# Patient Record
Sex: Male | Born: 1955
Health system: Southern US, Community
[De-identification: ages and names within clinical notes are randomized; demographics above are authoritative.]

## PROBLEM LIST (undated history)

## (undated) DIAGNOSIS — I700 Atherosclerosis of aorta: Secondary | ICD-10-CM

## (undated) DIAGNOSIS — E119 Type 2 diabetes mellitus without complications: Secondary | ICD-10-CM

## (undated) DIAGNOSIS — G8929 Other chronic pain: Secondary | ICD-10-CM

## (undated) DIAGNOSIS — I10 Essential (primary) hypertension: Secondary | ICD-10-CM

## (undated) DIAGNOSIS — E785 Hyperlipidemia, unspecified: Secondary | ICD-10-CM

## (undated) HISTORY — DX: Atherosclerosis of aorta: I70.0

## (undated) HISTORY — DX: Other chronic pain: G89.29

## (undated) HISTORY — DX: Type 2 diabetes mellitus without complications: E11.9

## (undated) HISTORY — DX: Hyperlipidemia, unspecified: E78.5

## (undated) HISTORY — DX: Essential (primary) hypertension: I10

---

## 1998-11-08 ENCOUNTER — Encounter: Payer: Self-pay | Admitting: Emergency Medicine

## 1998-11-08 ENCOUNTER — Emergency Department (HOSPITAL_COMMUNITY): Admission: EM | Admit: 1998-11-08 | Discharge: 1998-11-08 | Payer: Self-pay | Admitting: Emergency Medicine

## 1999-12-22 ENCOUNTER — Emergency Department (HOSPITAL_COMMUNITY): Admission: EM | Admit: 1999-12-22 | Discharge: 1999-12-22 | Payer: Self-pay | Admitting: Emergency Medicine

## 2001-08-25 ENCOUNTER — Encounter: Payer: Self-pay | Admitting: Infectious Diseases

## 2001-08-25 ENCOUNTER — Ambulatory Visit (HOSPITAL_COMMUNITY): Admission: RE | Admit: 2001-08-25 | Discharge: 2001-08-25 | Payer: Self-pay | Admitting: Infectious Diseases

## 2002-02-21 ENCOUNTER — Emergency Department (HOSPITAL_COMMUNITY): Admission: EM | Admit: 2002-02-21 | Discharge: 2002-02-21 | Payer: Self-pay | Admitting: Emergency Medicine

## 2002-02-24 ENCOUNTER — Encounter: Admission: RE | Admit: 2002-02-24 | Discharge: 2002-05-25 | Payer: Self-pay | Admitting: Emergency Medicine

## 2009-09-20 ENCOUNTER — Ambulatory Visit (HOSPITAL_COMMUNITY): Admission: RE | Admit: 2009-09-20 | Discharge: 2009-09-20 | Payer: Self-pay | Admitting: Gastroenterology

## 2011-01-15 LAB — GLUCOSE, CAPILLARY
Glucose-Capillary: 124 mg/dL — ABNORMAL HIGH (ref 70–99)
Glucose-Capillary: 149 mg/dL — ABNORMAL HIGH (ref 70–99)

## 2012-12-31 ENCOUNTER — Encounter: Payer: 59 | Attending: Family Medicine | Admitting: *Deleted

## 2012-12-31 ENCOUNTER — Encounter: Payer: Self-pay | Admitting: *Deleted

## 2012-12-31 DIAGNOSIS — Z713 Dietary counseling and surveillance: Secondary | ICD-10-CM | POA: Insufficient documentation

## 2012-12-31 DIAGNOSIS — E119 Type 2 diabetes mellitus without complications: Secondary | ICD-10-CM | POA: Insufficient documentation

## 2012-12-31 NOTE — Patient Instructions (Signed)
Plan:  Aim for 3-4 Carb Choices per meal (45-60 grams) +/- 1 either way  Aim for 0-2 Carbs per snack if hungry  Consider reading food labels for Total Carbohydrate of foods Continue with your activity level by jogging for 30-60 minutes daily as tolerated Consider checking BG at alternate times during the day to pick up any patterns

## 2012-12-31 NOTE — Progress Notes (Signed)
  Medical Nutrition Therapy:  Appt start time: 1500 end time:  1600.  Assessment:  Primary concerns today: diabetes refresher. He attended class about 4 years ago. Lives alone, does own shopping and meal preparation. Works for [employer] as a Engineer, civil (consulting) on various units. He is full time 7 AM to 7 PM 3 days a week. Enjoys reading , listening to music, and jogging outside.   MEDICATIONS: see list   DIETARY INTAKE:  Usual eating pattern includes 3 meals and 2-3 snacks per day.  Everyday foods include good variety of all food groups.  Avoided foods include chips, pork, .    24-hr recall:  B ( AM): used to skip, not hungry. Has started eating grits x 1 cup with water and OJ x 6 oz  Snk ( AM): usually fresh fruit  L ( PM): brings from home: salad OR cooked vegetables, less bread lately, water Snk ( PM): same as AM D (8 PM): prepares himself: lean meat, starch, and salad with Cesar or Olive oil dressing. Mix mango or quava juice with water Snk ( PM): banana or other fresh fruit Beverages: water, fruit juices  Usual physical activity: enjoy jogging twice a week in the park. Active praying 5 times a day  Estimated energy needs: 1600 calories 180 g carbohydrates 120 g protein 44 g fat  Progress Towards Goal(s):  In progress.   Nutritional Diagnosis:  NB-1.1 Food and nutrition-related knowledge deficit As related to diabetes management.  As evidenced by increase in A1c to 7.2%.    Intervention:  Nutrition counseling and diabetes educationreviewed. Discussed basic physiology of diabetes, SMBG and rationale of checking BG at alternate times of day, A1c, Carb Counting and benefits of continued activity. He shared that with his Muslim religion he prays about 5 times a day moving from a standing to kneeling to prone position which is a form of activity too. . Plan:  Aim for 3-4 Carb Choices per meal (45-60 grams) +/- 1 either way  Aim for 0-2 Carbs per snack if hungry  Consider reading food labels for  Total Carbohydrate of foods Continue with your activity level by jogging for 30-60 minutes daily as tolerated Consider checking BG at alternate times during the day to pick up any patterns  Handouts given during visit include: Living Well with Diabetes Carb Counting and Food Label handouts Meal Plan Card  Diabetes Medication Handout  Monitoring/Evaluation:  Dietary intake, exercise, SMBG at alternate times of day, and body weight prn. To continue with follow up with Gilman Buttner, RN at [facility] in September

## 2013-05-07 ENCOUNTER — Ambulatory Visit (INDEPENDENT_AMBULATORY_CARE_PROVIDER_SITE_OTHER): Payer: Self-pay | Admitting: Family Medicine

## 2013-05-07 VITALS — BP 134/80 | HR 73 | Ht 71.0 in | Wt 166.0 lb

## 2013-05-07 DIAGNOSIS — E119 Type 2 diabetes mellitus without complications: Secondary | ICD-10-CM | POA: Insufficient documentation

## 2013-05-07 NOTE — Progress Notes (Signed)
Subjective:  Patient presents for his first pharmacy appointment with the Link to [facility].   Patient presents today for 3 month diabetes follow-up as part of the employer-sponsored Link to Wellness program. Current diabetes regimen includes metformin & glipizide. Patient also continues on daily ACEi, and statin. Most recent MD follow-up was in April with Dr. Docia Chuck. Patient has a pending appt next month to see his PCP. At his last appointment glipizide was decreased to 10 mg in the morning and 5 mg in the evening because he was having hypoglycemia in the evening and bedtime.     Disease Assessments:  Diabetes:  Type of Diabetes: Type 2; Year of diagnosis 2005; Sees Diabetes provider 2 times per year; Diabetes Education [facility] 7 yrs ago. Will schedule for 1:1 refresher with dietitian @ [facility]; checks feet daily; checks blood glucose 2 times a day; uses glucometer; Lowest CBG 62; takes medications as prescribed; MD managing Diabetes Dibas Koirala; Current Diabetes related medical conditions are High blood pressure, High cholesterol; hypoglycemia frequency some;   7 day CBG average 137; 14 day CBG average 145; 30 day CBG average 132; Highest CBG 199;   Other Diabetes History: He has been checking his blood sugar twice daily- usually in the morning fasting, and then again in the evening post prandial, usually 1-2 hours after dinner.   He esimates he is having low blood sugars 2-3% of the time. Since cutting back on the glipizide dose he states that these have gotten better.      Nutrition- He is observing Ramadan now so he is only eating two meals a day, before sunrise and after sunset. He has recently lost weight.   Typical Day (non ramadan) he states that he pays close attention to what he eats and controls portion sizes.   B- cereal or egg sandwich. drinks coffee. sometimes orange juice, but this is not only day.   L- sometimes he doesn't eat; chicken sandwich. He admits to usually eating  a small piece of meat for lunch.   D- soup, fruits, bread.   He is lactose intolerant. He makes his own bread so as to avoid milk.   Physical Activity-  Stretching at home. Jogging at the park 3 days a week for about an hour.    Vital Signs:  05/07/2013 2:43 PM (EST) BMI 23.1; Height 5 ft 11 in; Weight 166 lbs    Testing:  Blood Sugar Tests:  Hemoglobin A1c: 7.0     Assessment/Plan: Patient is a 57 year old male with DM2. Most recent A1C was 7.0% which is close to meeting glycemic goal of less than 7%. Most recent CBG readings from his meter are somewhat erratic. He has been observing Ramadan for the past month and his eating habits are off his normal routine right now, so this may explain the fluctation in CBGs. He states he has lost weight and is down approximately 10 pounds since his last appointment with Harriett Sine 3 months ago.   Patient is engaging in physical activity three times a week and is getting 150 minutes of activity each week. Besides skipping lunch, nothing stood out on a 24 hour recall. I encouraged him to get something to eat during lunch daily, even if it was something small.   Follow up with patient in 3 months..    Goals for next visit-  1. Be mindful of meals- watch portion control.  2. Make sure that you eat something at lunch every day, even if it  is a small amount of food.  3. Increase how much water you are drinking.  4. Continue physical activity and jogging.   Next appointment is Monday October 27th at 11 AM.

## 2013-05-18 NOTE — Progress Notes (Signed)
Patient ID: Mike Hart, male   DOB: Jan 23, 1956, 57 y.o.   MRN: [id number] ATTENDING PHYSICIAN NOTE: I have reviewed the chart and agree with the plan as detailed above. Denny Levy MD Pager [phone]

## 2013-09-15 ENCOUNTER — Ambulatory Visit (INDEPENDENT_AMBULATORY_CARE_PROVIDER_SITE_OTHER): Payer: Self-pay | Admitting: Family Medicine

## 2013-09-15 VITALS — BP 118/70 | Wt 163.0 lb

## 2013-09-15 DIAGNOSIS — E119 Type 2 diabetes mellitus without complications: Secondary | ICD-10-CM

## 2013-09-15 NOTE — Progress Notes (Signed)
Patient presents today for 3 month diabetes follow-up as part of the employer-sponsored Link to Wellness program. Current diabetes regimen includes glipizide and metformin. Patient also continues on daily ACEi and statin. Most recent MD follow-up was this past October with Dr. Docia Chuck. At this time glipizide dose was decreased. Patient has a pending appt for 6 month follow-up. No other med changes or major health changes at this time.   Diabetes Assessment: Type of Diabetes: Type 2; Year of diagnosis 2005; Sees Diabetes provider 2 times per year; checks feet daily; checks blood glucose 2 times a day; uses glucometer; takes medications as prescribed; MD managing Diabetes Dibas Koirala; hypoglycemia frequency some; 30 day CBG average 124; 7 day CBG average 118; 14 day CBG average 122; Lowest CBG 71; Highest CBG 171; A1c 7.2 (prev 7.2) Other Diabetes History: Current med regimen includes Metformin 1000 mg twice daily and glipizide 10 mg AM and 5 mg PM. This is a dose decrease of glipizide due to hypoglycemia. Previously glipizide was 10 mg BID. Hypoglycemia has become less frequent since dose adjustment. Patient does maintain good medication compliance. Patient did bring meter today and is currently testing 2-3 times per day. Glucose monitoring occurs fasting/pre-prandial (supper)/at bedtime/when symptomatic. Glucose averages have improved and are ~20 mg/dl lower than those 3 months ago. Hypoglycemia frequency is only on occassional, and has dropped to 70s a couple times over previous month. Patient does not demonstrate appropriate correction of hypoglycemia, but we did review this today. I have provided a handout for patient and he has requested to purchase glucose tablets. Patient reports signs and symptoms of neuropathy including numbness/tingling/burning and symptoms of foot infection. Patient is due for yearly eye exam and will schedule an appt soon.  Lifestyle Factors: Nutrition- Patient feels his diet is  under good control. He reports eating 3-4 servings of fruit/vegetables per day and tries to eat a balanced diet of carbohydrate and protein. He does report lack of appetite at times, but attributes this to his body telling him he does not need anything at that time. I have recommneded he eat a little something for each main meal of the day to avoid hypoglycemia given he is on glipizide. Patient understands this.  B- cereal or egg sandwich. drinks coffee. sometimes orange juice, but this is not only day.  L- sometimes he doesn't eat; chicken sandwich. He admits to usually eating a small piece of meat for lunch.  D- soup, fruits, bread.  Physical Activity- Exercises at home daily including stretching. When weather is nice, pt jogs at a local park, usually a couple miles each time. He admits that he has not been compliant with jogging lately due to colder temperatures, but will try to get back on track as he finds this to be a great method of stress relief.   Assessment: Patient is a 57 year old male with DM2. Most recent A1C was 7.2% which is close to meeting glycemic goal of less than 7%. He maintains a healthy diet and attempts to exercise, but could use improvement in this area due to cooler temperatures. Patient will resume follow-up with Megan in 3 months.   Plan: 1) Continue making healthy dietary choices 2) Continue exercising daily, and resume jogging at least twice per week 3) Continue testing glucose regularly 4) Follow-up with Megan in 3 months on Monday March 9th @ 10:00 am

## 2014-01-05 ENCOUNTER — Ambulatory Visit (INDEPENDENT_AMBULATORY_CARE_PROVIDER_SITE_OTHER): Payer: Self-pay | Admitting: Family Medicine

## 2014-01-05 VITALS — BP 118/74 | Wt 167.0 lb

## 2014-01-05 DIAGNOSIS — E119 Type 2 diabetes mellitus without complications: Secondary | ICD-10-CM

## 2014-01-05 NOTE — Progress Notes (Signed)
Patient presents today for 3 month diabetes follow-up as part of the employer-sponsored Link to Wellness program. Current diabetes regimen includes Metformin and glipizide. Patient also continues on daily ACEi and statin. Most recent MD follow-up was Nov 2014. Patient has a pending appt for May 2015. He is seen every 6 mo for follow-up. No med changes or major health changes at this time.   Diabetes Assessment: Type of Diabetes: Type 2; Year of diagnosis 2005; Sees Diabetes provider 2 times per year; [facility]. [facility]; checks feet daily; checks blood glucose 2 times a day; uses glucometer; takes medications as prescribed; MD managing Diabetes Dibas Koirala; Current Diabetes related medical conditions are High blood pressure, High cholesterol; hypoglycemia frequency some; 30 day CBG average 129; 7 day CBG average 134; 14 day CBG average 134; Lowest CBG 65; Highest CBG 180; does not take an aspirin a day; Other Diabetes History: Current med regimen includes Metformin 1000 mg twice daily and glipizide 10 mg AM and 5 mg PM. Patient continues doing well on these medications. Patient reports good medication compliance, but patient is well overdue for prescription refill, so I am unsure of his true compliance. Patient did bring meter today and is currently testing twice per day. Glucose monitoring occurs fasting/pre-prandial (supper)/at bedtime/when symptomatic. Hypoglycemia frequency is only on occassional, and has dropped to 60s a couple times over previous month. Patient does demonstrate appropriate correction of hypoglycemia. Patient denies signs and symptoms of neuropathy including numbness/tingling/burning and symptoms of foot infection. Patient is not due for yearly eye exam and was recently evaluated in Feb 2015.   Lifestyle Factors: Nutrition- No changes to diet. Patient feels his diet is under good control. He reports eating 3-4 servings  of fruit/vegetables per day and tries to eat a balanced diet of carbohydrate and protein. He does report lack of appetite at times, but attributes this to his body telling him he does not need anything at that time. I have recommneded he eat a little something for each main meal of the day to avoid hypoglycemia given he is on glipizide. Patient understands this.  B- cereal or egg sandwich. drinks coffee and tea. sometimes orange juice, but this is not only day.  L- sometimes he doesn't eat; chicken sandwich. He admits to usually eating a small piece of meat for lunch.  D- soup, fruits, bread, limiting starches and sweets at night to reduce AM bloating Physical Activity-  Exercises at home daily including stretching. Pt walks or jogs at a local park, 2-3 times per week, usually a couple miles each time. He has recently been more compliant with exercise given the nicer weather.   Assessment: Patient is a 58 year old male with DM2. Most recent A1C was 7.2% which is close to meeting glycemic goal of less than 7%. He maintains a healthy diet and attempts to exercise, but could use improvement in this area due to cooler temperatures. Patient also appears to struggle with medication compliance given refill history. We will revisit this at next appt. Patient will follow-up in 3 months.   Plan: 1) Continue to maintain a healthy diet 2) Continue to exercise regularly, goal of 150 minutes per week 3) Continue testing regularly 4) Follow-up appt with MD in May 5) Follow-up in 3 months on Wednesday June 24th @ 1:30

## 2014-04-06 ENCOUNTER — Ambulatory Visit (INDEPENDENT_AMBULATORY_CARE_PROVIDER_SITE_OTHER): Payer: Self-pay | Admitting: Family Medicine

## 2014-04-06 DIAGNOSIS — E119 Type 2 diabetes mellitus without complications: Secondary | ICD-10-CM

## 2014-04-08 NOTE — Progress Notes (Signed)
Patient presents today for 3 month diabetes follow-up as part of the employer-sponsored Link to Wellness program. Current diabetes regimen includes Metformin and glipizide. Patient also continues on daily ACEi and statin. Most recent MD follow-up was May 2015. Patient has a pending appt for Nov 2015. He is seen every 6 mo for follow-up. No med changes or major health changes at this time.  Diabetes Assessment: Type of Diabetes: Type 2; Year of diagnosis 2005; Sees Diabetes [place] [facility] 2 times per year; [facility]. Will [facility]; checks feet daily; checks blood glucose 2 times a day; uses glucometer; takes medications as prescribed; MD managing Diabetes Dibas Koirala; Current Diabetes related medical conditions are High blood pressure, High cholesterol; hypoglycemia frequency some; does not take an aspirin a day; 30 day CBG average 136; 7 day CBG average 164; 14 day CBG average 140; Highest CBG 213; Lowest CBG 83 Other Diabetes History: Current med regimen includes Metformin 1000 mg twice daily and glipizide 5 mg twice daily. In the past, pt was taking glipizide 10 mg AM and 5 mg PM, but has reduced dose. I have suggested he resume previous dosing given his elevated A1c. Patient continues doing well on these medications. Patient is non-compliant at times and misses doses on occassion. Patient did bring meter today and is currently testing twice per day. Glucose monitoring occurs fasting/pre-prandial (supper)/at bedtime/when symptomatic. Hypoglycemia frequency is only on occassional, and has dropped to 80s a couple times over previous month. Glucose averages have increased by 20-30 points as well. Patient does demonstrate appropriate correction of hypoglycemia. Patient denies signs and symptoms of neuropathy including numbness/tingling/burning and symptoms of foot infection. Patient is not due for yearly eye exam and was recently evaluated in Feb  2015.  Lifestyle Factors: Nutrition- No changes to die, except pt is observing ramadan at this time and is fasting from sun up to sun down for 30 days. He eats a normal diet after sundown. He reports eating 3-4 servings of fruit/vegetables per day and tries to eat a balanced diet of carbohydrate and protein.  B- cereal or egg sandwich. drinks coffee and tea. sometimes orange juice, but this is not only day.  L- sometimes he doesn't eat; chicken sandwich. He admits to usually eating a small piece of meat for lunch.  D- soup, fruits, bread, limiting starches and sweets at night to reduce AM bloating Physical Activity-  Exercises at home daily including stretching. Pt walks or jogs at a local park, 2-3 times per week, usually a couple miles each time.   Assessment: Patient is a 58 year old male with DM2. Most recent A1C was 7.4% which is an increase from most recent A1c of 7.2. He maintains a healthy diet and attempts to exercise, but could use improvement in this area. Patient is not interested in med changes at this time, but would like to manage elevated A1c with diet and exercise alone. Patient will follow-up in 3 months.   Plan: 1) Continue making healthy dietary choices 2) Continue exercising regularly 3) Attempt to improve medication compliance, try to remember all medication doses 4) Follow-up in 3 months on Wednesday Sept 16th @ 1:30 pm

## 2014-05-30 ENCOUNTER — Encounter: Payer: Self-pay | Admitting: Family Medicine

## 2014-05-30 NOTE — Progress Notes (Signed)
Patient ID: Mike Hart, male   DOB: 12/26/1955, 58 y.o.   MRN: [id number] Reviewed:  Agree with the documentation and management of our pharmacologist. 

## 2014-05-30 NOTE — Progress Notes (Signed)
Patient ID: Mike Hart, male   DOB: 10/26/1955, 58 y.o.   MRN: [id number] Reviewed:  Agree with the documentation and management of our pharmacologist.

## 2014-05-30 NOTE — Progress Notes (Signed)
Patient ID: Mike Hart, male   DOB: 03/24/1956, 58 y.o.   MRN: [id number] Reviewed:  Agree with the documentation and management of our pharmacologist. 

## 2014-06-29 ENCOUNTER — Ambulatory Visit (INDEPENDENT_AMBULATORY_CARE_PROVIDER_SITE_OTHER): Payer: Self-pay | Admitting: Family Medicine

## 2014-06-29 VITALS — BP 128/78 | HR 67 | Wt 163.0 lb

## 2014-06-29 DIAGNOSIS — E119 Type 2 diabetes mellitus without complications: Secondary | ICD-10-CM

## 2014-06-29 NOTE — Progress Notes (Signed)
Patient presents today for 3 month diabetes follow-up as part of the employer-sponsored Link to Wellness program. Current diabetes regimen includes Metformin. Patient also continues on daily ACEi and statin. Most recent MD follow-up was May 2015. Patient has a pending appt for Nov 2015. He is seen every 6 mo for follow-up. No med changes or major health changes at this time. Of note, patient reports that his MD (Mike Hart), is helping him taper off pain medication (hydrocodone/apap). Patient has a long standing history of daily hydrocodone/apap 10/325 mg. In between these fills, patient also has small quantities of hydrocodone 5/325 filled). He reports today that he would like to taper off pain meds and his MD is helping with this and that his last Rx written 05/25/14 will be the last. I will follow-up with this at next LTW appt.  Diabetes Assessment: Type of Diabetes: Type 2; Year of diagnosis 2005; Sees Diabetes provider 2 times per year; Diabetes Education [facility] 7 yrs ago. Will schedule for 1:1 refresher with dietitian @ [facility]; checks feet daily; checks blood glucose 2 times a day; uses glucometer; takes medications as prescribed; MD managing Diabetes Mike Hart; Current Diabetes related medical conditions are High blood pressure, High cholesterol; hypoglycemia frequency some; does not take an aspirin a day; 7 day CBG average 120; 14 day CBG average 120; 30 day CBG average 120; Lowest CBG 97; Highest CBG 154; Other Diabetes History: Current med regimen includes Metformin 1000 mg twice daily. Patient has discontinued daily use of glipizide due to decreased glucose. He now takes glipizide only 1-2 times per week when glucose is elevated. I have suggested that patient monitor glucose carefully and resume glipizide daily if glucose begins trending upward. I have also encouraged him to report this med change to MD at November follow-up. Patient did bring meter today and is currently testing once per day, fasting.  Glucose monitoring occurs fasting/pre-prandial (supper)/at bedtime/when symptomatic. Hypoglycemia frequency is only on occassional, and has dropped to 70s a couple times over previous month. Glucose averages have decreased by 20-30 points as well. Patient does demonstrate appropriate correction of hypoglycemia. Patient denies signs and symptoms of neuropathy including numbness/tingling/burning and symptoms of foot infection. Patient is not due for yearly eye exam and was recently evaluated in Feb 2015.  Lifestyle Factors: Nutrition- No changes to diet. Patient reports he is eating relatively the same foods, but that he has reduced sweets (cookies, cakes, candy, etc). B- cereal or egg sandwich. drinks coffee and tea. sometimes orange juice, but this is not only day.  L- sometimes he doesn't eat; chicken sandwich. He admits to usually eating a small piece of meat for lunch.  D- soup, fruits, bread, limiting starches and sweets at night to reduce AM bloating Physical Activity-  Continues exercising regularly, walking/jogging 2-3 days per week. Exercises at home daily including stretching.   Assessment:

## 2014-08-31 NOTE — Progress Notes (Signed)
Patient ID: Mike Hart, male   DOB: 04/18/1956, 58 y.o.   MRN: [id number] Reviewed: Agree with the documentation and management of our [facility] Pharmacologist.

## 2014-10-19 ENCOUNTER — Ambulatory Visit (INDEPENDENT_AMBULATORY_CARE_PROVIDER_SITE_OTHER): Payer: Self-pay | Admitting: Family Medicine

## 2014-10-19 VITALS — BP 122/70 | Wt 161.0 lb

## 2014-10-19 DIAGNOSIS — E119 Type 2 diabetes mellitus without complications: Secondary | ICD-10-CM

## 2014-10-19 NOTE — Progress Notes (Signed)
Patient presents today for 3 month diabetes follow-up as part of the employer-sponsored Link to Wellness program. Current diabetes regimen includes Metformin and glipizide. Patient also continues on daily ACEi and statin. Most recent MD follow-up was fall 2015. Patient has a pending appt for spring 2016. He is seen every 6 mo for follow-up. No major health changes at this time. At most recent MD appt, glipizide was resumed at lower dose of 2.5 mg daily. Also, MD has discontinued hydrocodone and has replaced this with tramadol.   Diabetes Assessment: Type of Diabetes: Type 2; Year of diagnosis 2005; Sees Diabetes provider 2 times per year; uses glucometer; takes medications as prescribed; MD managing Diabetes Dibas Koirala; Current Diabetes related medical conditions are High blood pressure, High cholesterol; hypoglycemia frequency some; does not take an aspirin a day; 7 day CBG average 129; 30 day CBG average 116; 14 day CBG average 117; Highest CBG 135; Lowest CBG 88; checks blood glucose once daily; Other Diabetes History: Current med regimen includes Metformin 1000 mg twice daily and glipizide XL 2.5 mg daily with breakfast. Pt recently resumed glipizide at this lower dose and is doing well. He takes glipizide daily except with fasting glucose is <100. Patient did bring meter ([employer]) today and is currently testing once per day, fasting. Glucose monitoring occurs fasting and when symptomatic. Hypoglycemia frequency is only on occassion, and has dropped to 50 once in the previous month, corrected quickly with 4 oz soda. Glucose averages have decreased as well. Patient denies signs and symptoms of neuropathy including numbness/tingling/burning and symptoms of foot infection. Patient is not due for yearly eye exam and was recently evaluated in Feb 2015.  Lifestyle Factors: Nutrition- No changes to diet. Patient reports he is eating relatively the same foods, and continues to reduce sweets (cookies, cakes,  candy, etc). B- cereal or egg sandwich. drinks coffee and tea. sometimes orange juice, but this is not only day.  L- sometimes he doesn't eat; chicken sandwich. He admits to usually eating a small piece of meat for lunch.  D- soup, fruits, bread, limiting starches and sweets at night to reduce AM bloating Physical Activity-  Continues exercising regularly, but is walking/jogging less at this time due to colder temperatures. Patient continues exercising at home daily including stretching. 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   Plan: 1) Continue making healthy dietary choices 2) Continue exercising regularly 3) Continue to be compliant with medications 4) Follow-up in 3 months on Wednesday April 13th @ 2:00 pm

## 2014-11-22 ENCOUNTER — Encounter: Payer: Self-pay | Admitting: Family Medicine

## 2014-11-22 NOTE — Progress Notes (Signed)
Patient ID: Mike Hart, male   DOB: 04/02/1956, 59 y.o.   MRN: [id number] Reviewed: Agree with documentation and management of our [facility]ologist.

## 2015-01-25 ENCOUNTER — Ambulatory Visit: Payer: Self-pay | Admitting: Pharmacist

## 2015-01-25 ENCOUNTER — Ambulatory Visit (INDEPENDENT_AMBULATORY_CARE_PROVIDER_SITE_OTHER): Payer: Self-pay | Admitting: Family Medicine

## 2015-01-25 VITALS — BP 118/64 | Ht 71.0 in | Wt 164.0 lb

## 2015-01-25 DIAGNOSIS — E119 Type 2 diabetes mellitus without complications: Secondary | ICD-10-CM

## 2015-01-25 NOTE — Patient Instructions (Addendum)
1)  Maintain exercise including jogging at least three times per week and stretching daily, great job with this 2)  Attempt to maintain healthy diet, focus on portion control especially sweets and salty foods 3)  Increase fruits and vegetables 4)  Attempt to maintain current weight of 164 lb or less 5)  Follow-up in 3 months on Wednesday July 13th @ 2:00 pm  Great to see you today!

## 2015-01-25 NOTE — Progress Notes (Signed)
Subjective:  Patient presents today for 3 month diabetes follow-up as part of the employer-sponsored [facility] to [facility] program.  Current diabetes regimen includes Metformin and glipizide.  Patient takes ASA only on occasion.  He does take daily ACEi and statin.  Most recent MD follow-up was 3 months ago with Dr. Darrow Bussingibas Koirala.  Patient has a pending appt for follow-up every 5 months.  No med changes or major health changes at this time.  Of note, patient takes Tramadol once daily and was switched to this 3 months ago.  He reports being dissatisfied with this change as he believes it causes indigestion.  He will speak with his prescriber about this.    Assessment/Plan:  Patient is a 59 yo male with DM type 2. Most recent A1C was 6.8% which is at goal of less than 7%. Weight has increased by 3 lbs since last LTW visit.  Patient's current diabetes regimen includes Metformin 1,000 mg twice daily and glipizide 2.5 mg daily.  He is compliant with metformin but takes glipizide only when fasting glucose is >140.  Patient did bring meter today and is currently testing at least once daily, fasting, and more if symptomatic or hypoglycemic.  Averages are as follows:  7d-117, 14d-121, 30d-120.  Fasting glucose ranges from 60-150.  Patient is aware of how to properly correct hypoglycemia and is able to recognize symptoms.  He denies signs/symptoms of neuropathy.  He sees a dentist regularly, but is overdue for yearly eye exam.    Lifestyle:  Physical Activity- Patient walks or jogs at least three times per week and does stretching exercises several times per week.  He usually exercises for at least 30 minutes each time. Nutrition-  Patient feels his diet is under good control.  He attempts to make healthy choices and manage portion sizes.  He feels he could improve his intake of fruits and vegetables and would like to work on this.     Goals for Next Visit: 1)  Maintain exercise including jogging at least three  times per week and stretching daily, great job with this 2)  Attempt to maintain healthy diet, focus on portion control especially sweets and salty foods 3)  Increase fruits and vegetables 4)  Attempt to maintain current weight of 164 lb or less 5)  Follow-up in 3 months on Wednesday July 13th @ 2:00 pm

## 2015-02-03 NOTE — Progress Notes (Signed)
Patient ID: Mike Hart, male   DOB: 02/28/1956, 59 y.o.   MRN: [id number] ATTENDING PHYSICIAN NOTE: I have reviewed the chart and agree with the plan as detailed above. [name] MD Pager [phone]

## 2015-04-26 ENCOUNTER — Ambulatory Visit: Payer: 59 | Admitting: Pharmacist

## 2015-05-02 ENCOUNTER — Ambulatory Visit (INDEPENDENT_AMBULATORY_CARE_PROVIDER_SITE_OTHER): Payer: Self-pay | Admitting: Family Medicine

## 2015-05-02 ENCOUNTER — Ambulatory Visit: Payer: 59 | Admitting: Pharmacist

## 2015-05-02 VITALS — BP 128/60 | Ht 71.0 in | Wt 163.0 lb

## 2015-05-02 DIAGNOSIS — E119 Type 2 diabetes mellitus without complications: Secondary | ICD-10-CM

## 2015-05-02 NOTE — Patient Instructions (Addendum)
1)  Continue to exercise at least three days per week, great job maintaining this 2)  Continue to make healthy dietary choices, increasing fruits and vegetables 3)  Schedule an eye exam before next visit. 4)  Follow-up in 3 months on Thursday October 20th @ 10:00 am  Great to see you today!

## 2015-05-02 NOTE — Progress Notes (Signed)
Subjective:  Patient presents today for 3 month diabetes follow-up as part of the employer-sponsored [facility] to Wellness program.  Current diabetes regimen includes Metformin and glipizide.  Patient takes ASA only on occasion.  He does take daily ACEi and statin.  Most recent MD follow-up was June 2016 with Dr. Darrow Bussingibas Koirala.  Patient has a pending appt for follow-up every 6 months.  No med changes or major health changes at this time.  Of note, patient has been prescribed Tramadol once daily and was switched to this approx 6 months ago.  Despite prescription being written once daily, pt reports he takes 1.5 tablets daily on several days per week.  He reports being referred to a pain clinic for further management.  I continue to be unsure of the cause of patients pain, he is very generalized when he discusses symptoms of his pain.    A1c still at goal of less than 7.   Assessment/Plan:  Patient is a 59 yo male with DM type 2. Most recent A1C was less than 7 per patient report.  Weight has decreased by 1 lb since last LTW visit.  Patient's current diabetes regimen includes Metformin 1,000 mg twice daily and glipizide 2.5 mg daily (glipizide was prescribed at 5 mg TID but pt takes only 2.5 mg daily due to hypoglycemia).    Patient did bring meter today and is currently testing once daily, fasting, and more if symptomatic or hypoglycemic.   Patient is aware of how to properly correct hypoglycemia and is able to recognize symptoms.  He denies signs/symptoms of neuropathy.  He sees a dentist regularly, but is overdue for yearly eye exam.  He will schedule appt soon.       Lifestyle:  Physical Activity- Patient walks or jogs at least three times per week and does stretching exercises several times per week.  He usually exercises for at least 30 minutes each time. Nutrition-  Patient feels his diet is under good control.  He attempts to make healthy choices and manage portion sizes.  He feels he could improve  his intake of fruits and vegetables and would like to work on this.     Goals for Next Visit: 1)  Continue to exercise at least three days per week, great job maintaining this 2)  Continue to make healthy dietary choices, increasing fruits and vegetables 3)  Schedule an eye exam before next visit. 4)  Follow-up in 3 months on Thursday October 20th @ 10:00 am  Orlin HildingElizabeth Doreatha Offer, PharmD [facility]  [phone]

## 2015-05-09 NOTE — Progress Notes (Signed)
ATTENDING PHYSICIAN NOTE: I have reviewed the chart and agree with the plan as detailed above. Jaxxon Naeem MD Pager [phone]  

## 2015-05-12 ENCOUNTER — Ambulatory Visit (HOSPITAL_COMMUNITY)
Admission: RE | Admit: 2015-05-12 | Discharge: 2015-05-12 | Disposition: A | Discharge status: Home / Self Care | Payer: 59 | Source: Ambulatory Visit | Attending: Physician Assistant | Admitting: Physician Assistant

## 2015-05-12 ENCOUNTER — Other Ambulatory Visit (HOSPITAL_COMMUNITY): Payer: Self-pay | Admitting: Physician Assistant

## 2015-05-12 DIAGNOSIS — M542 Cervicalgia: Secondary | ICD-10-CM

## 2015-05-12 DIAGNOSIS — R52 Pain, unspecified: Secondary | ICD-10-CM

## 2015-05-12 DIAGNOSIS — M47892 Other spondylosis, cervical region: Secondary | ICD-10-CM | POA: Insufficient documentation

## 2015-05-12 DIAGNOSIS — M47896 Other spondylosis, lumbar region: Secondary | ICD-10-CM | POA: Diagnosis not present

## 2015-07-31 ENCOUNTER — Other Ambulatory Visit: Payer: Self-pay | Admitting: Pain Medicine

## 2015-07-31 DIAGNOSIS — M549 Dorsalgia, unspecified: Secondary | ICD-10-CM

## 2015-07-31 DIAGNOSIS — M542 Cervicalgia: Secondary | ICD-10-CM

## 2015-08-03 ENCOUNTER — Ambulatory Visit: Payer: 59 | Admitting: Pharmacist

## 2015-08-16 ENCOUNTER — Other Ambulatory Visit: Payer: 59

## 2015-08-16 ENCOUNTER — Ambulatory Visit (INDEPENDENT_AMBULATORY_CARE_PROVIDER_SITE_OTHER): Payer: Self-pay | Admitting: Family Medicine

## 2015-08-16 ENCOUNTER — Encounter: Payer: Self-pay | Admitting: Pharmacist

## 2015-08-16 VITALS — BP 118/72 | Wt 164.0 lb

## 2015-08-16 DIAGNOSIS — E119 Type 2 diabetes mellitus without complications: Secondary | ICD-10-CM

## 2015-08-16 NOTE — Progress Notes (Signed)
Subjective:  Patient presents today for 3 month diabetes follow-up as part of the employer-sponsored Link to Wellness program.  Current diabetes regimen includes Metformin and glipizide.  Patient takes ASA only on occasion.  He does take daily ACEi and statin.  Most recent MD follow-up was June 2016 with Dr. Darrow Bussingibas Koirala.  Patient has a pending appt for follow-up every 6 months.  No med changes or major health changes at this time.  Of note, patient is now being followed by pain management, Dr. Pernell DupreAdams.  He was switched from Tramadol to Oxycodone/APAP 7.5/325 mg.  He was also prescribed Amrix for muscle spasm.  He continues having a difficult time verbalizing to me where his pain is located and what caused his original pain.  He states pain today is 2 out of 10, but increases to 8-9 on work days.  Oxy/APAP prescription is written to be used twice daily as needed and he states he does not use it daily and on work days he takes only one tablet.     Assessment/Plan:  Patient is a 59 yo male with DM type 2. Most recent A1C was less than 7 per patient report.  Weight remains stable.  Patient's current diabetes regimen includes Metformin 1,000 mg twice daily and glipizide 2.5 mg daily as needed (glipizide was prescribed at 5 mg TID but pt takes only 2.5 mg as needed due to hypoglycemia).    Patient did bring meter today and is currently testing 2-3 times weekly, random times, and more if symptomatic or hypoglycemic.   Patient is aware of how to properly correct hypoglycemia and is able to recognize symptoms.  He denies signs/symptoms of neuropathy or infection.  He sees a dentist regularly, and is now up to date on exam.  He is up to date on eye exam and is seen regularly to monitor cataracts.  Surgery not yet indicated.    Lifestyle:  Physical Activity- Patient continues walking/jogging at [facility] [place] at 2-3 times per week when weather is good.  If weather is poor, patient exercises indoor at his home doing  stretching exercises and yoga.  He usually exercises for at least 30 minutes each time.     Nutrition-  Patient feels his diet is under good control.  He attempts to make healthy choices and manage portion sizes.  He feels he could improve his intake of fruits and vegetables and would like to work on this.  On a positive note, he continues limiting sweets, and is not eating fried foods at this time. Beverages include coffee and hot tea sweetened with artificial sweetener.  He drinks diet soda on occasion.      Goals for Next Visit: 1)  Continue to exercise at least three days per week, great job maintaining this 2)  Continue to make healthy dietary choices, increasing fruits and vegetables 3) Follow-up in 3 months on Wednesday Feb 1st @ 9:00 am  Great to see you today!   Orlin HildingElizabeth Alania Overholt, PharmD [facility]  [phone]

## 2015-08-16 NOTE — Patient Instructions (Signed)
1)  Continue to exercise at least three days per week, great job maintaining this 2)  Continue to make healthy dietary choices, increasing fruits and vegetables 3) Follow-up in 3 months on Wednesday Feb 1st @ 9:00 am  Great to see you today!

## 2015-08-17 ENCOUNTER — Ambulatory Visit: Payer: 59 | Admitting: Pharmacist

## 2015-08-27 ENCOUNTER — Other Ambulatory Visit: Payer: 59

## 2015-08-28 ENCOUNTER — Ambulatory Visit
Admission: RE | Admit: 2015-08-28 | Discharge: 2015-08-28 | Disposition: A | Discharge status: Home / Self Care | Payer: 59 | Source: Ambulatory Visit | Attending: Pain Medicine | Admitting: Pain Medicine

## 2015-08-28 DIAGNOSIS — M542 Cervicalgia: Secondary | ICD-10-CM

## 2015-08-28 DIAGNOSIS — M549 Dorsalgia, unspecified: Secondary | ICD-10-CM

## 2015-08-29 NOTE — Progress Notes (Signed)
I have reviewed this pharmacist's note and agree  

## 2015-10-27 DIAGNOSIS — I10 Essential (primary) hypertension: Secondary | ICD-10-CM | POA: Diagnosis not present

## 2015-10-27 DIAGNOSIS — Z7984 Long term (current) use of oral hypoglycemic drugs: Secondary | ICD-10-CM | POA: Diagnosis not present

## 2015-10-27 DIAGNOSIS — E785 Hyperlipidemia, unspecified: Secondary | ICD-10-CM | POA: Diagnosis not present

## 2015-10-27 DIAGNOSIS — E1165 Type 2 diabetes mellitus with hyperglycemia: Secondary | ICD-10-CM | POA: Diagnosis not present

## 2015-10-27 DIAGNOSIS — J309 Allergic rhinitis, unspecified: Secondary | ICD-10-CM | POA: Diagnosis not present

## 2015-10-27 MED FILL — traMADol HCL 50 MG TABS: 50 | 30 days supply | Qty: 30 | Fill #0

## 2015-10-27 MED FILL — LISINOPRIL-HCTZ 20-25 MG TA: 20-25 | 90 days supply | Qty: 90 | Fill #0

## 2015-10-27 MED FILL — FLUTICASONE PROP 50 MCG SPR: 50 | 30 days supply | Qty: 16 | Fill #0

## 2015-10-27 MED FILL — ATORVASTATIN 20 MG TABLET: 20 | 90 days supply | Qty: 90 | Fill #0

## 2015-10-30 DIAGNOSIS — M545 Low back pain: Secondary | ICD-10-CM | POA: Diagnosis not present

## 2015-10-30 DIAGNOSIS — M79606 Pain in leg, unspecified: Secondary | ICD-10-CM | POA: Diagnosis not present

## 2015-10-30 DIAGNOSIS — G894 Chronic pain syndrome: Secondary | ICD-10-CM | POA: Diagnosis not present

## 2015-10-30 DIAGNOSIS — M47817 Spondylosis without myelopathy or radiculopathy, lumbosacral region: Secondary | ICD-10-CM | POA: Diagnosis not present

## 2015-11-01 DIAGNOSIS — H25013 Cortical age-related cataract, bilateral: Secondary | ICD-10-CM | POA: Diagnosis not present

## 2015-11-01 DIAGNOSIS — H5203 Hypermetropia, bilateral: Secondary | ICD-10-CM | POA: Diagnosis not present

## 2015-11-01 DIAGNOSIS — H2513 Age-related nuclear cataract, bilateral: Secondary | ICD-10-CM | POA: Diagnosis not present

## 2015-11-01 DIAGNOSIS — H52223 Regular astigmatism, bilateral: Secondary | ICD-10-CM | POA: Diagnosis not present

## 2015-11-15 ENCOUNTER — Encounter: Payer: Self-pay | Admitting: Pharmacist

## 2015-11-15 ENCOUNTER — Ambulatory Visit (INDEPENDENT_AMBULATORY_CARE_PROVIDER_SITE_OTHER): Payer: Self-pay | Admitting: Family Medicine

## 2015-11-15 VITALS — BP 118/70 | Wt 158.0 lb

## 2015-11-15 DIAGNOSIS — E119 Type 2 diabetes mellitus without complications: Secondary | ICD-10-CM

## 2015-11-15 MED FILL — TRUE METRIX GLUCOSE TEST ST: 90 days supply | Qty: 100 | Fill #0

## 2015-11-15 MED FILL — TRUEplus LANCETS 30G MISC: 90 days supply | Qty: 100 | Fill #0

## 2015-11-15 NOTE — Patient Instructions (Signed)
1)  Continue making healthy diet choices and monitor portion size, attempt to add more fresh fruit/vegetable/legumes 2)  Attempt to resume exercising three times per week as weather permits 3)  Continue testing glucose 4)  Follow-up with MD for repeat A1c (appt already schedule) 5)  Schedule appt with dentist 6)  Follow-up with [facility] in 6 months on Wednesday August 2nd @ 2:00 pm  Great to see you today!

## 2015-11-15 NOTE — Progress Notes (Signed)
Subjective:  Patient presents today for 3 month diabetes follow-up as part of the employer-sponsored Link to Wellness program.  Current diabetes regimen includes Metformin and glipizide.  Patient takes ASA only on occasion.  He does take daily ACEi and statin.  Most recent MD follow-up was Jan 2017 with Dr. Darrow Bussing.  Patient has a pending appt for follow-up in 3 months, April (vs 6 due to elevated A1c).  No med changes or major health changes at this time.  Of note, patient continues being followed by pain management, Dr. Pernell Dupre.  He is now on tramadol and is no longer taking Oxycodone/APAP.     Assessment/Plan:  Patient is a 60 yo male with DM type 2. Most recent A1C was increased to 7.8% per patient report.  No med changes were made, but pt is being seen in 3 mo (vs 6) as a result.  Weight has decreased by 6 lb since last visit.  Patient's current diabetes regimen includes Metformin 1,000 mg twice daily and glipizide 2.5 mg daily as needed (glipizide was prescribed at 5 mg TID but pt takes only 2.5 mg as needed due to hypoglycemia).    Patient did bring meter today and is currently testing 4-5 times weekly, usually once daily, sometimes twice daily.  He is using TrueResult meter, but we will switch to TrueMetrix today.   Patient is aware of how to properly correct hypoglycemia and is able to recognize symptoms.  He does have lows in the 70s on occasion.  Hyperglycemia is rare over the past month with highs only in 170s. He denies signs/symptoms of neuropathy or infection.  He sees a Education officer, community regularly, with last appt being summer 2017.  He is due for dental surgery but was waiting for benefits to renew for better coverage of cost.  He will schedule an appt soon.  He is up to date on eye exam and is seen regularly to monitor cataracts.  Surgery not yet indicated.   Still testing several times per week, sometimes twice daily.  30d - 125, 14d - 120, 7d - 110 This AM fasting - 101 Lowest 70s, highest  170s Corrects with OJ, corrects easily   Lifestyle:  Physical Activity- Patient continues walking/jogging at [facility] once per week due to colder weather (usually exercises 3 times weekly during good weather).  He also continues exercising at home including yoga and stretching.    Nutrition-  Patient has lost 6 lbs, mostly due to dietary changes over the past three months.  Portion control has been his focus and he is now eating approximately 1/2 the servings size he was eating previously.  He has also reduced fats and salts (ex: butter).  He is drinking more water and has eliminated soda.  He would like to set a goal to eat more fruits/vegetables/legumes.         Goals for Next Visit: 1)  Continue making healthy diet choices and monitor portion size, attempt to add more fresh fruit/vegetable/legumes 2)  Attempt to resume exercising three times per week as weather permits 3)  Continue testing glucose 4)  Follow-up with MD for repeat A1c (appt already schedule) 5)  Schedule appt with dentist 6)  Follow-up with [facility] in 6 months on Wednesday August 2nd @ 2:00 pm  Great to see you today!   Orlin Hilding, PharmD [facility]  [phone]

## 2015-11-20 NOTE — Progress Notes (Signed)
I have reviewed this pharmacist's note and agree  

## 2015-11-24 MED FILL — traMADol HCL 50 MG TABS: 50 | 30 days supply | Qty: 30 | Fill #1

## 2015-12-20 MED FILL — FLUTICASONE PROP 50 MCG SPR: 50 | 30 days supply | Qty: 16 | Fill #1

## 2015-12-22 MED FILL — traMADol HCL 50 MG TABS: 50 | 30 days supply | Qty: 30 | Fill #2

## 2016-01-03 MED FILL — HYDROCODON-APAP 5-325: 5-325 | 4 days supply | Qty: 20 | Fill #0

## 2016-01-03 MED FILL — CLINDAMYCIN HCL 150 MG CAPS: 150 | 7 days supply | Qty: 28 | Fill #0

## 2016-01-12 MED FILL — metFORMIN HCL 1000 MG TABS: 1000 | 90 days supply | Qty: 180 | Fill #2

## 2016-01-23 MED FILL — traMADol HCL 50 MG TABS: 50 | 30 days supply | Qty: 30 | Fill #0

## 2016-02-13 MED FILL — HYDROCODON-APAP 5-325: 5-325 | 4 days supply | Qty: 20 | Fill #0

## 2016-02-15 MED FILL — LISINOPRIL-HCTZ 20-25 MG TA: 20-25 | 90 days supply | Qty: 90 | Fill #1

## 2016-02-15 MED FILL — ATORVASTATIN 20 MG TABLET: 20 | 90 days supply | Qty: 90 | Fill #1

## 2016-02-19 MED FILL — ACETAMINOPHEN/COD #3 TABLET: 300-30 | 4 days supply | Qty: 20 | Fill #0

## 2016-02-23 MED FILL — traMADol HCL 50 MG TABS: 50 | 30 days supply | Qty: 30 | Fill #1

## 2016-03-13 DIAGNOSIS — Z7984 Long term (current) use of oral hypoglycemic drugs: Secondary | ICD-10-CM | POA: Diagnosis not present

## 2016-03-13 DIAGNOSIS — G894 Chronic pain syndrome: Secondary | ICD-10-CM | POA: Diagnosis not present

## 2016-03-13 DIAGNOSIS — I10 Essential (primary) hypertension: Secondary | ICD-10-CM | POA: Diagnosis not present

## 2016-03-13 DIAGNOSIS — E1165 Type 2 diabetes mellitus with hyperglycemia: Secondary | ICD-10-CM | POA: Diagnosis not present

## 2016-03-13 DIAGNOSIS — E7800 Pure hypercholesterolemia, unspecified: Secondary | ICD-10-CM | POA: Diagnosis not present

## 2016-03-22 MED FILL — traMADol HCL 50 MG TABS: 50 | 30 days supply | Qty: 30 | Fill #2

## 2016-03-29 MED FILL — TRUE METRIX GLUCOSE TEST ST: 90 days supply | Qty: 100 | Fill #1

## 2016-04-19 MED FILL — traMADol HCL 50 MG TABS: 50 | 30 days supply | Qty: 30 | Fill #0

## 2016-05-14 ENCOUNTER — Other Ambulatory Visit: Payer: Self-pay | Admitting: Pharmacist

## 2016-05-14 VITALS — BP 122/62 | Wt 155.0 lb

## 2016-05-14 DIAGNOSIS — E119 Type 2 diabetes mellitus without complications: Secondary | ICD-10-CM

## 2016-05-14 MED FILL — LISINOPRIL-HCTZ 20-25 MG TA: 20-25 | 90 days supply | Qty: 90 | Fill #2

## 2016-05-14 MED FILL — metFORMIN HCL 1000 MG TABS: 1000 | 90 days supply | Qty: 180 | Fill #0

## 2016-05-14 MED FILL — ATORVASTATIN 20 MG TABLET: 20 | 90 days supply | Qty: 90 | Fill #2

## 2016-05-14 NOTE — Patient Outreach (Signed)
[facility]) Care Management  05/14/2016  Mike Hart [birth year] [id number]   Subjective:  Patient presents today for diabetes follow-up as part of the employer-sponsored Link to Wellness program. Current diabetes regimen includes Metformin and glipizide. Patient takes ASA only on occasion.  He does take daily ACEi and statin. Most recent MD follow-up was May 2017 with Dr. Darrow Bussing. Patient has a pending appt for follow-up in 6 months, December 2017. No med changes or major health changes at this time.  Of note, patient is no longer being followed by pain management.  He continues on tramadol and remains off Oxycodone/APAP.     Assessment/Plan:  Patient is a 60 yo male with DM type 2. Most recent A1C was improved to 6.9% (prev 7.8%) per patient report.  No med changes were made, and pt was able to resume follow-up every 6 mo (vs 3 mo).  Weight has decreased by an additional 3 lb since last visit.  Patient's current diabetes regimen includes Metformin 1,000 mg twice daily and glipizide 2.5 mg daily as needed (glipizide was prescribed at 5 mg TID but pt takes only 2.5 mg as needed due to hypoglycemia).    Patient did bring meter today and is currently testing 2-3 times weekly, usually in the evening before bed, but occasionally fasting.  He is TrueMetrix meter.   No hypoglycemia at this time.  Patient is aware of how to properly correct hypoglycemia and is able to recognize symptoms.  He denies signs/symptoms of neuropathy or infection.  He sees a Education officer, community regularly, with last appt being summer 2017.   He is up to date on eye exam and is seen regularly to monitor cataracts.  Surgery not yet indicated.   7d - 112 14d - 118 30d - 114   Lifestyle:  Physical Activity- Patient continues walking/jogging at [facility] 2-3 times per week.  He also continues exercising at home including yoga and stretching.  This summer he has also stayed active doing yard work  and gardening.      Nutrition-  Patient has lost an additional 3 lbs, mostly due to dietary changes over the past three months.  Portion control continues to be his focus.  He has also reduced fats, salts, and sugary foods.  He is drinking more water and has eliminated soda.       Goals for Next Visit: 1)  Continue making healthy diet choices and monitor portion size, attempt to add more fresh fruit/vegetable/legumes 2)  Continue to maintain exercise at least 3 times per week 3)  Continue testing glucose regularly 4)  Great job with reduced A1c! 5)  Follow-up with [facility] in 3 months on Tuesday October 31st @ 11:00 am  Great to see you today!

## 2016-05-15 ENCOUNTER — Ambulatory Visit: Payer: 59 | Admitting: Pharmacist

## 2016-05-20 MED FILL — traMADol HCL 50 MG TABS: 50 | 30 days supply | Qty: 30 | Fill #1

## 2016-06-13 DIAGNOSIS — T1502XA Foreign body in cornea, left eye, initial encounter: Secondary | ICD-10-CM | POA: Diagnosis not present

## 2016-06-13 MED FILL — KETOROLAC 0.4% OPHTH SOLN: 0.4 | 25 days supply | Qty: 5 | Fill #0

## 2016-06-13 MED FILL — TOBRAMYCIN 0.3% EYE DROPS: 0.3 | 20 days supply | Qty: 5 | Fill #0

## 2016-06-19 MED FILL — traMADol HCL 50 MG TABS: 50 | 30 days supply | Qty: 30 | Fill #2

## 2016-07-19 MED FILL — traMADol HCL 50 MG TABS: 50 | 30 days supply | Qty: 30 | Fill #0

## 2016-08-13 ENCOUNTER — Ambulatory Visit: Payer: Self-pay | Admitting: Pharmacist

## 2016-08-16 MED FILL — traMADol HCL 50 MG TABS: 50 | 30 days supply | Qty: 30 | Fill #1

## 2016-08-20 ENCOUNTER — Encounter: Payer: Self-pay | Admitting: Pharmacist

## 2016-08-20 ENCOUNTER — Other Ambulatory Visit: Payer: Self-pay | Admitting: Pharmacist

## 2016-08-20 VITALS — BP 110/60 | Wt 160.0 lb

## 2016-08-20 DIAGNOSIS — E119 Type 2 diabetes mellitus without complications: Secondary | ICD-10-CM

## 2016-08-20 MED FILL — LISINOPRIL-HCTZ 20-25 MG TA: 20-25 | 90 days supply | Qty: 90 | Fill #3

## 2016-08-20 MED FILL — ATORVASTATIN 20 MG TABLET: 20 | 90 days supply | Qty: 90 | Fill #3

## 2016-08-20 MED FILL — metFORMIN HCL 1000 MG TABS: 1000 | 90 days supply | Qty: 180 | Fill #1

## 2016-08-20 MED FILL — TRUE METRIX GLUCOSE TEST ST: 90 days supply | Qty: 100 | Fill #2

## 2016-08-20 NOTE — Patient Outreach (Signed)
[facility]) Care Management  08/20/2016  Murrell ConverseZeinelabdin A Earnhart 12/10/1955 [id number]   Subjective:  Patient presents today for diabetes follow-up as part of the employer-sponsored Link to Wellness program. Current diabetes regimen includes Metformin and glipizide. Patient takes ASA daily.  He does take daily ACEi and statin. Most recent MD follow-up was May 2017 with Dr. Darrow Bussingibas Koirala. Patient has a pending appt for follow-up in 6 months, December 2017. No med changes or major health changes at this time.  Of note, patient is no longer being followed by pain management.  He continues on tramadol and remains off Oxycodone/APAP.      Assessment/Plan:  Patient is a 60 yo male with DM type 2. Most recent A1C was improved to 6.9% (prev 7.8%) per patient report.  No med changes were made, and pt was able to resume follow-up every 6 mo (vs 3 mo).  Weight has remained the same since last visit.  Patient's current diabetes regimen includes Metformin 1,000 mg twice daily and glipizide 2.5 mg daily as needed (takes a dose only on occasion).   Patient did bring meter today and is currently testing 1-3 times weekly, usually in the evening before bed, but occasionally fasting or when symptomatic.  Readings range 65-160.  He uses TrueMetrix meter.   Hypoglycemia rarely at this time.  Patient is aware of how to properly correct hypoglycemia and is able to recognize symptoms.  He denies signs/symptoms of neuropathy or infection.  He sees a Education officer, communitydentist regularly, with last appt being summer 2017.   He is up to date on eye exam and is seen regularly to monitor cataracts, next appt due December 2017.  Surgery not yet indicated.   Averages: 7d - 125 14d - 138 30d - 125    Lifestyle:  Physical Activity- Patient [place] [facility] [place] times per week.  He also continues exercising at home including yoga and stretching.    Nutrition-   Portion control continues to  be his focus.  He has also reduced fats, salts, and sugary foods.  He is drinking more water and has eliminated soda.      Breakfast - oatmeal with sugar, grits Lunch - vegetable soup, bread, sometimes eats beans as well Supper - Beans, sometimes has a vegetable or fruit Snacks - on occasion, fruit, sometime candy Beverages - Juice (has a juicer, adds only a small amount of equal), trying to limit soda (drinking 1 soda per week as a treat), water, coffee and tea with sweetener   Goals for Next Visit: 1)  Continue making healthy diet choices and monitor portion size, attempt to add more fresh fruit/vegetable/legumes 2)  Continue to maintain exercise at least 3 times per week 3)  Continue testing glucose regularly 4)  Maintain reduced A1c! 5)  [facility] will contact you to schedule next LTW visit.   Great to see you today!   Orlin HildingElizabeth Rob Mciver, PharmD [facility]  [phone]

## 2016-09-13 MED FILL — traMADol HCL 50 MG TABS: 50 | 30 days supply | Qty: 30 | Fill #0

## 2016-09-16 DIAGNOSIS — G894 Chronic pain syndrome: Secondary | ICD-10-CM | POA: Diagnosis not present

## 2016-09-16 DIAGNOSIS — E7800 Pure hypercholesterolemia, unspecified: Secondary | ICD-10-CM | POA: Diagnosis not present

## 2016-09-16 DIAGNOSIS — E1165 Type 2 diabetes mellitus with hyperglycemia: Secondary | ICD-10-CM | POA: Diagnosis not present

## 2016-09-16 DIAGNOSIS — Z7984 Long term (current) use of oral hypoglycemic drugs: Secondary | ICD-10-CM | POA: Diagnosis not present

## 2016-09-16 DIAGNOSIS — I10 Essential (primary) hypertension: Secondary | ICD-10-CM | POA: Diagnosis not present

## 2016-10-15 MED FILL — traMADol HCL 50 MG TABS: 50 | 30 days supply | Qty: 30 | Fill #1

## 2016-11-13 MED FILL — traMADol HCL 50 MG TABS: 50 | 30 days supply | Qty: 30 | Fill #0

## 2016-12-11 MED FILL — traMADol HCL 50 MG TABS: 50 | 30 days supply | Qty: 30 | Fill #1

## 2016-12-20 MED FILL — metFORMIN HCL 1000 MG TABS: 1000 | 90 days supply | Qty: 180 | Fill #0

## 2016-12-20 MED FILL — LISINOPRIL-HCTZ 20-25 MG TA: 20-25 | 90 days supply | Qty: 90 | Fill #0

## 2016-12-20 MED FILL — ATORVASTATIN 20 MG TABLET: 20 | 90 days supply | Qty: 90 | Fill #0

## 2016-12-20 MED FILL — FLUTICASONE PROP 50 MCG SPR: 50 | 30 days supply | Qty: 16 | Fill #0

## 2017-01-07 DIAGNOSIS — Z7984 Long term (current) use of oral hypoglycemic drugs: Secondary | ICD-10-CM | POA: Diagnosis not present

## 2017-01-07 DIAGNOSIS — I10 Essential (primary) hypertension: Secondary | ICD-10-CM | POA: Diagnosis not present

## 2017-01-07 DIAGNOSIS — E1165 Type 2 diabetes mellitus with hyperglycemia: Secondary | ICD-10-CM | POA: Diagnosis not present

## 2017-01-07 DIAGNOSIS — G894 Chronic pain syndrome: Secondary | ICD-10-CM | POA: Diagnosis not present

## 2017-01-08 MED FILL — traMADol HCL 50 MG TABS: 50 | 30 days supply | Qty: 30 | Fill #0

## 2017-02-06 MED FILL — traMADol HCL 50 MG TABS: 50 | 30 days supply | Qty: 30 | Fill #1

## 2017-03-06 MED FILL — traMADol HCL 50 MG TABS: 50 | 30 days supply | Qty: 30 | Fill #2

## 2017-03-20 MED FILL — ATORVASTATIN 20 MG TABLET: 20 | 90 days supply | Qty: 90 | Fill #0

## 2017-03-20 MED FILL — metFORMIN HCL 1000 MG TABS: 1000 | 90 days supply | Qty: 225 | Fill #0

## 2017-03-24 MED FILL — LISINOPRIL-HCTZ 20-25 MG TA: 20-25 | 90 days supply | Qty: 90 | Fill #0

## 2017-04-11 MED FILL — traMADol HCL 50 MG TABS: 50 | 30 days supply | Qty: 30 | Fill #0

## 2017-05-09 MED FILL — traMADol HCL 50 MG TABS: 50 | 30 days supply | Qty: 30 | Fill #1

## 2017-05-16 ENCOUNTER — Other Ambulatory Visit: Payer: Self-pay | Admitting: Pharmacist

## 2017-05-16 ENCOUNTER — Encounter: Payer: Self-pay | Admitting: Pharmacist

## 2017-05-16 VITALS — BP 121/76 | HR 74 | Wt 159.0 lb

## 2017-05-16 DIAGNOSIS — E119 Type 2 diabetes mellitus without complications: Secondary | ICD-10-CM

## 2017-05-16 NOTE — Patient Outreach (Signed)
[facility]) Care Management  05/16/2017  Mike Hart 04/13/1956 [id number]  Subjective: Patient presents today for 3 month diabetes follow-up as part of the employer-sponsored Link to Wellness program.  Current diabetes regimen includes metformin.  Patient also continues on daily aspirin and atorvastatin.  Patient endorses compliance to medications. Most recent MD follow-up was April 2018.  Patient has a pending appt for October 2018 (every 6 months).  No major health changes at this time, though he discontinued glipizide since last visit with [facility].  Patient reported dietary habits: Eats 3 meals/day Breakfast: grits, oatmeal Lunch: chicken soup homemade, cheese, peaches, mangoes Dinner: beef sandwich, peaches Snacks: fruits (any kind) Drinks: water, lemonade sugar free, coffee artificial sweetener  Patient reported exercise habits: jogs 2 miles 1-2 times weekly, stretch, walking at work constantly (4 days a week)  Patient denies hypoglycemic events since stopping glipizide several months ago  Patient reports nocturia 2-3 times.  Patient denies pain/burning upon urination.  Patient denies neuropathy. Patient denies visual changes. Patient reports self foot exams. Stable, no issues.   Patient reported self monitored blood glucose frequency 3 times/week Home fasting CBG: 100s-120s, excursions to 140s  Objective:  No results found for: HGBA1C last A1C = 7.2 per patient report (April 2018) Vitals:   05/16/17 1110  BP: 121/76  Pulse: 74   Filed Weights   05/16/17 1110  Weight: 159 lb (72.1 kg)   Lipid Panel  No results found for: CHOL, TRIG, HDL, CHOLHDL, VLDL, LDLCALC, LDLDIRECT  10 year ASCVD risk: Unable to calculate in absence of lipid panel  Last eye exam: ~1.5 years ago Last dental exam: 6 months ago  Assessment: Diabetes: uncontrolled, limited by dietary indiscretion - A1C 7.2% which is at above goal of less than 7%.  - Weight  is down per patient report. Has been trying to lose weight.  Physical Activity- below goal, limited by work schedule and busy lifestyle. Currently exercising ~60 minutes a week. - Below goal of at least 150 minutes of moderate intensity exercise weekly   Nutrition- maintaining a carbohydrate-rich diet with fruits and oatmeal - Maintaining adherence to diabetic diet 70% of the time  Plan/Goals for Next Visit: - Diet goal: more green and non-starchy veggies  - Exercise goal: walk 30 minutes to 1 hour on days he does not work (~2 hours) - Needs to go to eye doctor, recommended yearly eye exam - Detailed goals and action plans are listed below  Next appointment to see me is: 08/20/2017  Devota Pacearoline Johnay Mano, PharmD [facility] PGY2 Pharmacy Resident [phone]  [facility] CM Care Plan Problem One     Most Recent Value  Care Plan Problem One  Exercise below goal  Role Documenting the Problem One  Clinical Pharmacist  Care Plan for Problem One  Active  [facility] Long Term Goal   Within the next 90 days, patient will increase exercise by walking or jogging 30 minutes to 1 hour on days he does not work as evidenced by patient report  [facility] Long Term Goal Start Date  05/16/17  Interventions for Problem One Long Term Goal  Educated on role of exercise to glycemic control, Encouraged patient    [facility] CM Care Plan Problem Two     Most Recent Value  Care Plan Problem Two  Carbohydrate-rich diet  Role Documenting the Problem Two  Clinical Pharmacist  Care Plan for Problem Two  Active  Interventions for Problem Two Long Term Goal   Counseled on the  plate method and educated patient on non-starchy vegetable options  [facility] Long Term Goal  Within the next 90 days, patient will increase the amount of non-starchy vegetables and decrease the amount of carbohydrates in his diet by using the plate method as evidenced by patient report and A1c  [facility] Long Term Goal Start Date  05/16/17    [facility] CM Care Plan Problem Three     Most  Recent Value  Care Plan Problem Three  Needs eye exam  Role Documenting the Problem Three  Clinical Pharmacist  Care Plan for Problem Three  Active  [facility] Long Term Goal   Within the next 90 days, patient will make an appointment with his eye doctor as evidenced by patient report  [facility] Long Term Goal Start Date  05/16/17  Interventions for Problem Three Long Term Goal  Recommended yearly eye exam per ADA standards

## 2017-06-06 MED FILL — traMADol HCL 50 MG TABS: 50 | 30 days supply | Qty: 30 | Fill #2

## 2017-06-27 DIAGNOSIS — H2513 Age-related nuclear cataract, bilateral: Secondary | ICD-10-CM | POA: Diagnosis not present

## 2017-06-27 DIAGNOSIS — H25013 Cortical age-related cataract, bilateral: Secondary | ICD-10-CM | POA: Diagnosis not present

## 2017-06-27 DIAGNOSIS — H52223 Regular astigmatism, bilateral: Secondary | ICD-10-CM | POA: Diagnosis not present

## 2017-06-27 DIAGNOSIS — H5203 Hypermetropia, bilateral: Secondary | ICD-10-CM | POA: Diagnosis not present

## 2017-06-27 DIAGNOSIS — E119 Type 2 diabetes mellitus without complications: Secondary | ICD-10-CM | POA: Diagnosis not present

## 2017-06-27 DIAGNOSIS — H524 Presbyopia: Secondary | ICD-10-CM | POA: Diagnosis not present

## 2017-06-27 DIAGNOSIS — Z7984 Long term (current) use of oral hypoglycemic drugs: Secondary | ICD-10-CM | POA: Diagnosis not present

## 2017-07-08 MED FILL — traMADol HCL 50 MG TABS: 50 | 30 days supply | Qty: 30 | Fill #0

## 2017-07-14 DIAGNOSIS — I10 Essential (primary) hypertension: Secondary | ICD-10-CM | POA: Diagnosis not present

## 2017-07-14 DIAGNOSIS — E1165 Type 2 diabetes mellitus with hyperglycemia: Secondary | ICD-10-CM | POA: Diagnosis not present

## 2017-07-14 DIAGNOSIS — G894 Chronic pain syndrome: Secondary | ICD-10-CM | POA: Diagnosis not present

## 2017-07-14 DIAGNOSIS — E7800 Pure hypercholesterolemia, unspecified: Secondary | ICD-10-CM | POA: Diagnosis not present

## 2017-07-14 MED FILL — ATORVASTATIN 20 MG TABLET: 20 | 90 days supply | Qty: 90 | Fill #0

## 2017-07-14 MED FILL — LISINOPRIL-HCTZ 20-25 MG TA: 20-25 | 90 days supply | Qty: 90 | Fill #0

## 2017-07-14 MED FILL — metFORMIN HCL 1000 MG TABS: 1000 | 90 days supply | Qty: 225 | Fill #0

## 2017-08-05 MED FILL — traMADol HCL 50 MG TABS: 50 | 30 days supply | Qty: 30 | Fill #0

## 2017-08-20 ENCOUNTER — Ambulatory Visit: Payer: Self-pay | Admitting: Pharmacist

## 2017-08-27 ENCOUNTER — Telehealth: Payer: Self-pay | Admitting: Pharmacist

## 2017-08-27 NOTE — Patient Outreach (Signed)
[facility] Care Management  08/27/2017  Murrell ConverseZeinelabdin A Tripp 08/14/1956 [id number]   Spoke to patient via phone advising that disease self-management services will be transitioned from the [facility] or [facility] in 2019.  Also advised that a letter will be mailed to the home residence with details of this transition.            Will close case to [facility] diabetes program  Allena Katzaroline E Pavel Gadd, Pharm.D. PGY2 Ambulatory Care Pharmacy Resident Phone: [phone]

## 2017-09-03 MED FILL — traMADol HCL 50 MG TABS: 50 | 30 days supply | Qty: 30 | Fill #0

## 2017-10-03 MED FILL — traMADol HCL 50 MG TABS: 50 | 30 days supply | Qty: 30 | Fill #0

## 2017-10-31 MED FILL — traMADol HCL 50 MG TABS: 50 | 30 days supply | Qty: 30 | Fill #1

## 2017-11-10 MED FILL — metFORMIN HCL 1000 MG TABS: 1000 | 90 days supply | Qty: 225 | Fill #1

## 2017-11-10 MED FILL — ATORVASTATIN 20 MG TABLET: 20 | 90 days supply | Qty: 90 | Fill #1

## 2017-11-10 MED FILL — LISINOPRIL-HCTZ 20-25 MG TA: 20-25 | 90 days supply | Qty: 90 | Fill #1

## 2017-12-01 MED FILL — traMADol HCL 50 MG TABS: 50 | 30 days supply | Qty: 30 | Fill #2

## 2017-12-31 MED FILL — FLUTICASONE PROP 50 MCG SPR: 50 | 30 days supply | Qty: 16 | Fill #0

## 2018-01-01 MED FILL — traMADol HCL 50 MG TABS: 50 | 30 days supply | Qty: 30 | Fill #0

## 2018-01-30 MED FILL — traMADol HCL 50 MG TABS: 50 | 30 days supply | Qty: 30 | Fill #1

## 2018-02-16 DIAGNOSIS — J309 Allergic rhinitis, unspecified: Secondary | ICD-10-CM | POA: Diagnosis not present

## 2018-02-16 DIAGNOSIS — Z125 Encounter for screening for malignant neoplasm of prostate: Secondary | ICD-10-CM | POA: Diagnosis not present

## 2018-02-16 DIAGNOSIS — E7800 Pure hypercholesterolemia, unspecified: Secondary | ICD-10-CM | POA: Diagnosis not present

## 2018-02-16 DIAGNOSIS — E1165 Type 2 diabetes mellitus with hyperglycemia: Secondary | ICD-10-CM | POA: Diagnosis not present

## 2018-02-16 DIAGNOSIS — Z0000 Encounter for general adult medical examination without abnormal findings: Secondary | ICD-10-CM | POA: Diagnosis not present

## 2018-02-16 DIAGNOSIS — G894 Chronic pain syndrome: Secondary | ICD-10-CM | POA: Diagnosis not present

## 2018-02-16 DIAGNOSIS — I10 Essential (primary) hypertension: Secondary | ICD-10-CM | POA: Diagnosis not present

## 2018-02-27 MED FILL — traMADol HCL 50 MG TABS: 50 | 30 days supply | Qty: 30 | Fill #2

## 2018-03-12 MED FILL — LISINOPRIL-HCTZ 20-25 MG TA: 20-25 | 90 days supply | Qty: 90 | Fill #2

## 2018-03-12 MED FILL — ATORVASTATIN 20 MG TABLET: 20 | 90 days supply | Qty: 90 | Fill #2

## 2018-03-12 MED FILL — metFORMIN HCL 1000 MG TABS: 1000 | 90 days supply | Qty: 225 | Fill #2

## 2018-04-02 MED FILL — traMADol HCL 50 MG TABS: 50 | 30 days supply | Qty: 30 | Fill #0

## 2018-04-23 MED FILL — FLUTICASONE PROP 50 MCG SPR: 50 | 30 days supply | Qty: 16 | Fill #0

## 2018-05-01 MED FILL — traMADol HCL 50 MG TABS: 50 | 30 days supply | Qty: 30 | Fill #1

## 2018-05-29 MED FILL — traMADol HCL 50 MG TABS: 50 | 30 days supply | Qty: 30 | Fill #2

## 2018-06-30 MED FILL — FREESTYLE LITE METER: 30 days supply | Qty: 1 | Fill #0

## 2018-06-30 MED FILL — FREESTYLE LITE TEST STRIP: 33 days supply | Qty: 100 | Fill #0

## 2018-06-30 MED FILL — FREESTYLE LANCETS: 33 days supply | Qty: 100 | Fill #0

## 2018-07-03 MED FILL — traMADol HCL 50 MG TABS: 50 | 30 days supply | Qty: 30 | Fill #0

## 2018-07-20 MED FILL — LISINOPRIL-HCTZ 20-25 MG TA: 20-25 | 90 days supply | Qty: 90 | Fill #0

## 2018-07-20 MED FILL — metFORMIN HCL 1000 MG TABS: 1000 | 90 days supply | Qty: 225 | Fill #0

## 2018-07-20 MED FILL — ATORVASTATIN CALCIUM 20 MG: 20 | 90 days supply | Qty: 90 | Fill #0

## 2018-08-03 MED FILL — traMADol HCL 50 MG TABS: 50 | 30 days supply | Qty: 30 | Fill #0

## 2018-09-01 DIAGNOSIS — G894 Chronic pain syndrome: Secondary | ICD-10-CM | POA: Diagnosis not present

## 2018-09-01 DIAGNOSIS — Z7984 Long term (current) use of oral hypoglycemic drugs: Secondary | ICD-10-CM | POA: Diagnosis not present

## 2018-09-01 DIAGNOSIS — E7800 Pure hypercholesterolemia, unspecified: Secondary | ICD-10-CM | POA: Diagnosis not present

## 2018-09-01 DIAGNOSIS — I10 Essential (primary) hypertension: Secondary | ICD-10-CM | POA: Diagnosis not present

## 2018-09-01 DIAGNOSIS — E1165 Type 2 diabetes mellitus with hyperglycemia: Secondary | ICD-10-CM | POA: Diagnosis not present

## 2018-09-01 MED FILL — traMADol HCL 50 MG TABS: 50 | 30 days supply | Qty: 30 | Fill #0

## 2018-09-30 MED FILL — traMADol HCL 50 MG TABS: 50 | 30 days supply | Qty: 30 | Fill #1

## 2018-10-22 MED FILL — ATORVASTATIN CALCIUM 20 MG: 20 | 90 days supply | Qty: 90 | Fill #0

## 2018-10-22 MED FILL — metFORMIN HCL 1000 MG TABS: 1000 | 90 days supply | Qty: 225 | Fill #0

## 2018-10-22 MED FILL — LISINOPRIL-HCTZ 20-25 MG TA: 20-25 | 90 days supply | Qty: 90 | Fill #0

## 2018-10-30 MED FILL — traMADol HCL 50 MG TABS: 50 | 30 days supply | Qty: 30 | Fill #2

## 2018-11-27 MED FILL — traMADol HCL 50 MG TABS: 50 | 30 days supply | Qty: 30 | Fill #3

## 2018-11-27 MED FILL — FLUTICASONE PROP 50 MCG SPR: 50 | 30 days supply | Qty: 16 | Fill #1

## 2018-11-27 MED FILL — FREESTYLE LITE TEST STRIP: 33 days supply | Qty: 100 | Fill #1

## 2018-12-08 MED FILL — HYDROCODON-APAP 5-325: 5-325 | 4 days supply | Qty: 20 | Fill #0

## 2018-12-29 DIAGNOSIS — E1165 Type 2 diabetes mellitus with hyperglycemia: Secondary | ICD-10-CM | POA: Diagnosis not present

## 2018-12-29 DIAGNOSIS — G894 Chronic pain syndrome: Secondary | ICD-10-CM | POA: Diagnosis not present

## 2018-12-29 DIAGNOSIS — I10 Essential (primary) hypertension: Secondary | ICD-10-CM | POA: Diagnosis not present

## 2018-12-29 DIAGNOSIS — E7800 Pure hypercholesterolemia, unspecified: Secondary | ICD-10-CM | POA: Diagnosis not present

## 2018-12-29 MED FILL — LISINOPRIL-HCTZ 20-25 MG TA: 20-25 | 90 days supply | Qty: 90 | Fill #0

## 2018-12-29 MED FILL — ATORVASTATIN 20 MG TABLET: 20 | 90 days supply | Qty: 90 | Fill #0

## 2018-12-29 MED FILL — metFORMIN HCL 1000 MG TABS: 1000 | 90 days supply | Qty: 225 | Fill #0

## 2018-12-29 MED FILL — traMADol HCL 50 MG TABS: 50 | 90 days supply | Qty: 90 | Fill #0

## 2018-12-31 ENCOUNTER — Other Ambulatory Visit: Payer: Self-pay | Admitting: Oral Surgery

## 2018-12-31 DIAGNOSIS — K048 Radicular cyst: Secondary | ICD-10-CM | POA: Diagnosis not present

## 2018-12-31 MED FILL — HYDROCODON-APAP 5-325: 5-325 | 2 days supply | Qty: 8 | Fill #0

## 2018-12-31 MED FILL — CHLORHEXIDINE 0.12% RINSE: 0.12 | 15 days supply | Qty: 473 | Fill #0

## 2019-04-02 DIAGNOSIS — E1165 Type 2 diabetes mellitus with hyperglycemia: Secondary | ICD-10-CM | POA: Diagnosis not present

## 2019-04-02 DIAGNOSIS — I10 Essential (primary) hypertension: Secondary | ICD-10-CM | POA: Diagnosis not present

## 2019-04-02 DIAGNOSIS — Z7984 Long term (current) use of oral hypoglycemic drugs: Secondary | ICD-10-CM | POA: Diagnosis not present

## 2019-04-02 DIAGNOSIS — G894 Chronic pain syndrome: Secondary | ICD-10-CM | POA: Diagnosis not present

## 2019-04-02 DIAGNOSIS — E7800 Pure hypercholesterolemia, unspecified: Secondary | ICD-10-CM | POA: Diagnosis not present

## 2019-04-02 DIAGNOSIS — Z79899 Other long term (current) drug therapy: Secondary | ICD-10-CM | POA: Diagnosis not present

## 2019-07-02 DIAGNOSIS — Z23 Encounter for immunization: Secondary | ICD-10-CM | POA: Diagnosis not present

## 2019-08-30 DIAGNOSIS — Z79899 Other long term (current) drug therapy: Secondary | ICD-10-CM | POA: Diagnosis not present

## 2019-08-30 DIAGNOSIS — E1165 Type 2 diabetes mellitus with hyperglycemia: Secondary | ICD-10-CM | POA: Diagnosis not present

## 2019-08-30 DIAGNOSIS — Z7984 Long term (current) use of oral hypoglycemic drugs: Secondary | ICD-10-CM | POA: Diagnosis not present

## 2019-08-30 DIAGNOSIS — Z23 Encounter for immunization: Secondary | ICD-10-CM | POA: Diagnosis not present

## 2019-08-30 DIAGNOSIS — Z125 Encounter for screening for malignant neoplasm of prostate: Secondary | ICD-10-CM | POA: Diagnosis not present

## 2019-08-30 DIAGNOSIS — G894 Chronic pain syndrome: Secondary | ICD-10-CM | POA: Diagnosis not present

## 2019-08-30 DIAGNOSIS — H6123 Impacted cerumen, bilateral: Secondary | ICD-10-CM | POA: Diagnosis not present

## 2019-08-30 DIAGNOSIS — I10 Essential (primary) hypertension: Secondary | ICD-10-CM | POA: Diagnosis not present

## 2019-08-30 DIAGNOSIS — Z0000 Encounter for general adult medical examination without abnormal findings: Secondary | ICD-10-CM | POA: Diagnosis not present

## 2019-08-30 DIAGNOSIS — E7800 Pure hypercholesterolemia, unspecified: Secondary | ICD-10-CM | POA: Diagnosis not present

## 2019-12-16 ENCOUNTER — Ambulatory Visit: Payer: 59 | Attending: Internal Medicine

## 2019-12-16 DIAGNOSIS — Z23 Encounter for immunization: Secondary | ICD-10-CM | POA: Insufficient documentation

## 2019-12-16 NOTE — Progress Notes (Signed)
   Covid-19 Vaccination Clinic  Name:  KIJUAN GALLICCHIO    MRN: [id number] DOB: 01/24/1956  12/16/2019  Mr. Loadholt was observed post Covid-19 immunization for 15 minutes without incident. He was provided with Vaccine Information Sheet and instruction to access the V-Safe system.   Mr. Wainer was instructed to call 911 with any severe reactions post vaccine: Marland Kitchen Difficulty breathing  . Swelling of face and throat  . A fast heartbeat  . A bad rash all over body  . Dizziness and weakness   Immunizations Administered    Name Date Dose VIS Date Route   Pfizer COVID-19 Vaccine 12/16/2019  4:11 PM 0.3 mL 09/24/2019 Intramuscular   Manufacturer: [employer], [employer]   Lot: [id number]   NDC: [address]-3

## 2020-01-12 ENCOUNTER — Ambulatory Visit: Payer: Self-pay | Attending: Internal Medicine

## 2020-01-12 DIAGNOSIS — Z23 Encounter for immunization: Secondary | ICD-10-CM

## 2020-01-12 NOTE — Progress Notes (Signed)
   Covid-19 Vaccination Clinic  Name:  Mike Hart    MRN: [id number] DOB: Jun 06, 1956  01/12/2020  Mike Hart was observed post Covid-19 immunization for 15 minutes without incident. He was provided with Vaccine Information Sheet and instruction to access the V-Safe system.   Mike Hart was instructed to call 911 with any severe reactions post vaccine: Marland Kitchen Difficulty breathing  . Swelling of face and throat  . A fast heartbeat  . A bad rash all over body  . Dizziness and weakness   Immunizations Administered    Name Date Dose VIS Date Route   Pfizer COVID-19 Vaccine 01/12/2020 10:30 AM 0.3 mL 09/24/2019 Intramuscular   Manufacturer: [employer], [employer]   Lot: [id number]   NDC: [address]-3

## 2020-02-28 DIAGNOSIS — Z7984 Long term (current) use of oral hypoglycemic drugs: Secondary | ICD-10-CM | POA: Diagnosis not present

## 2020-02-28 DIAGNOSIS — G894 Chronic pain syndrome: Secondary | ICD-10-CM | POA: Diagnosis not present

## 2020-02-28 DIAGNOSIS — E1165 Type 2 diabetes mellitus with hyperglycemia: Secondary | ICD-10-CM | POA: Diagnosis not present

## 2020-02-28 DIAGNOSIS — I10 Essential (primary) hypertension: Secondary | ICD-10-CM | POA: Diagnosis not present

## 2020-02-28 DIAGNOSIS — E7800 Pure hypercholesterolemia, unspecified: Secondary | ICD-10-CM | POA: Diagnosis not present

## 2020-02-28 DIAGNOSIS — H9192 Unspecified hearing loss, left ear: Secondary | ICD-10-CM | POA: Diagnosis not present

## 2020-02-28 DIAGNOSIS — Z79899 Other long term (current) drug therapy: Secondary | ICD-10-CM | POA: Diagnosis not present

## 2020-03-09 DIAGNOSIS — H9012 Conductive hearing loss, unilateral, left ear, with unrestricted hearing on the contralateral side: Secondary | ICD-10-CM | POA: Diagnosis not present

## 2020-03-09 DIAGNOSIS — H6121 Impacted cerumen, right ear: Secondary | ICD-10-CM | POA: Diagnosis not present

## 2020-06-29 DIAGNOSIS — M545 Low back pain: Secondary | ICD-10-CM | POA: Diagnosis not present

## 2020-06-29 DIAGNOSIS — M542 Cervicalgia: Secondary | ICD-10-CM | POA: Diagnosis not present

## 2020-07-10 DIAGNOSIS — M549 Dorsalgia, unspecified: Secondary | ICD-10-CM | POA: Diagnosis not present

## 2020-07-10 DIAGNOSIS — M47892 Other spondylosis, cervical region: Secondary | ICD-10-CM | POA: Diagnosis not present

## 2020-07-10 DIAGNOSIS — M4304 Spondylolysis, thoracic region: Secondary | ICD-10-CM | POA: Diagnosis not present

## 2020-07-10 DIAGNOSIS — M542 Cervicalgia: Secondary | ICD-10-CM | POA: Diagnosis not present

## 2020-07-10 DIAGNOSIS — M546 Pain in thoracic spine: Secondary | ICD-10-CM | POA: Diagnosis not present

## 2020-07-28 DIAGNOSIS — H2513 Age-related nuclear cataract, bilateral: Secondary | ICD-10-CM | POA: Diagnosis not present

## 2020-07-28 DIAGNOSIS — H5203 Hypermetropia, bilateral: Secondary | ICD-10-CM | POA: Diagnosis not present

## 2020-07-28 DIAGNOSIS — H524 Presbyopia: Secondary | ICD-10-CM | POA: Diagnosis not present

## 2020-07-28 DIAGNOSIS — E119 Type 2 diabetes mellitus without complications: Secondary | ICD-10-CM | POA: Diagnosis not present

## 2020-07-28 DIAGNOSIS — H52223 Regular astigmatism, bilateral: Secondary | ICD-10-CM | POA: Diagnosis not present

## 2020-08-07 DIAGNOSIS — M4304 Spondylolysis, thoracic region: Secondary | ICD-10-CM | POA: Diagnosis not present

## 2020-08-07 DIAGNOSIS — M47892 Other spondylosis, cervical region: Secondary | ICD-10-CM | POA: Diagnosis not present

## 2020-08-07 DIAGNOSIS — M546 Pain in thoracic spine: Secondary | ICD-10-CM | POA: Diagnosis not present

## 2020-08-07 DIAGNOSIS — M542 Cervicalgia: Secondary | ICD-10-CM | POA: Diagnosis not present

## 2020-08-11 ENCOUNTER — Other Ambulatory Visit: Payer: Self-pay | Admitting: Orthopaedic Surgery

## 2020-08-11 DIAGNOSIS — M542 Cervicalgia: Secondary | ICD-10-CM

## 2020-09-05 DIAGNOSIS — Z125 Encounter for screening for malignant neoplasm of prostate: Secondary | ICD-10-CM | POA: Diagnosis not present

## 2020-09-05 DIAGNOSIS — I10 Essential (primary) hypertension: Secondary | ICD-10-CM | POA: Diagnosis not present

## 2020-09-05 DIAGNOSIS — G894 Chronic pain syndrome: Secondary | ICD-10-CM | POA: Diagnosis not present

## 2020-09-05 DIAGNOSIS — E7800 Pure hypercholesterolemia, unspecified: Secondary | ICD-10-CM | POA: Diagnosis not present

## 2020-09-05 DIAGNOSIS — Z0001 Encounter for general adult medical examination with abnormal findings: Secondary | ICD-10-CM | POA: Diagnosis not present

## 2020-09-05 DIAGNOSIS — Z79899 Other long term (current) drug therapy: Secondary | ICD-10-CM | POA: Diagnosis not present

## 2020-09-05 DIAGNOSIS — E1165 Type 2 diabetes mellitus with hyperglycemia: Secondary | ICD-10-CM | POA: Diagnosis not present

## 2020-09-05 DIAGNOSIS — Z23 Encounter for immunization: Secondary | ICD-10-CM | POA: Diagnosis not present

## 2020-09-06 ENCOUNTER — Ambulatory Visit
Admission: RE | Admit: 2020-09-06 | Discharge: 2020-09-06 | Disposition: A | Discharge status: Home / Self Care | Payer: Medicare HMO | Source: Ambulatory Visit | Attending: Orthopaedic Surgery | Admitting: Orthopaedic Surgery

## 2020-09-06 ENCOUNTER — Other Ambulatory Visit: Payer: Self-pay

## 2020-09-06 DIAGNOSIS — M50222 Other cervical disc displacement at C5-C6 level: Secondary | ICD-10-CM | POA: Diagnosis not present

## 2020-09-06 DIAGNOSIS — M50221 Other cervical disc displacement at C4-C5 level: Secondary | ICD-10-CM | POA: Diagnosis not present

## 2020-09-06 DIAGNOSIS — M542 Cervicalgia: Secondary | ICD-10-CM

## 2020-09-06 DIAGNOSIS — M47812 Spondylosis without myelopathy or radiculopathy, cervical region: Secondary | ICD-10-CM | POA: Diagnosis not present

## 2020-09-12 DIAGNOSIS — Z1211 Encounter for screening for malignant neoplasm of colon: Secondary | ICD-10-CM | POA: Diagnosis not present

## 2020-09-14 DIAGNOSIS — M5481 Occipital neuralgia: Secondary | ICD-10-CM | POA: Diagnosis not present

## 2020-09-14 DIAGNOSIS — M47892 Other spondylosis, cervical region: Secondary | ICD-10-CM | POA: Diagnosis not present

## 2020-09-14 DIAGNOSIS — M542 Cervicalgia: Secondary | ICD-10-CM | POA: Diagnosis not present

## 2020-09-14 DIAGNOSIS — M4304 Spondylolysis, thoracic region: Secondary | ICD-10-CM | POA: Diagnosis not present

## 2020-10-24 DIAGNOSIS — M791 Myalgia, unspecified site: Secondary | ICD-10-CM | POA: Diagnosis not present

## 2020-10-24 DIAGNOSIS — M5481 Occipital neuralgia: Secondary | ICD-10-CM | POA: Diagnosis not present

## 2020-10-24 DIAGNOSIS — M542 Cervicalgia: Secondary | ICD-10-CM | POA: Diagnosis not present

## 2020-10-24 DIAGNOSIS — M47892 Other spondylosis, cervical region: Secondary | ICD-10-CM | POA: Diagnosis not present

## 2020-11-21 ENCOUNTER — Emergency Department (HOSPITAL_COMMUNITY)
Admission: EM | Admit: 2020-11-21 | Discharge: 2020-11-22 | Disposition: A | Discharge status: Home / Self Care | Payer: Medicare HMO | Attending: Emergency Medicine | Admitting: Emergency Medicine

## 2020-11-21 ENCOUNTER — Emergency Department (HOSPITAL_COMMUNITY): Payer: Medicare HMO

## 2020-11-21 ENCOUNTER — Encounter (HOSPITAL_COMMUNITY): Payer: Self-pay | Admitting: *Deleted

## 2020-11-21 ENCOUNTER — Other Ambulatory Visit: Payer: Self-pay

## 2020-11-21 DIAGNOSIS — I10 Essential (primary) hypertension: Secondary | ICD-10-CM | POA: Diagnosis not present

## 2020-11-21 DIAGNOSIS — M791 Myalgia, unspecified site: Secondary | ICD-10-CM | POA: Diagnosis not present

## 2020-11-21 DIAGNOSIS — Z79899 Other long term (current) drug therapy: Secondary | ICD-10-CM | POA: Diagnosis not present

## 2020-11-21 DIAGNOSIS — I440 Atrioventricular block, first degree: Secondary | ICD-10-CM | POA: Diagnosis not present

## 2020-11-21 DIAGNOSIS — E119 Type 2 diabetes mellitus without complications: Secondary | ICD-10-CM | POA: Insufficient documentation

## 2020-11-21 DIAGNOSIS — R0789 Other chest pain: Secondary | ICD-10-CM | POA: Diagnosis not present

## 2020-11-21 DIAGNOSIS — R079 Chest pain, unspecified: Secondary | ICD-10-CM | POA: Diagnosis not present

## 2020-11-21 DIAGNOSIS — Z7982 Long term (current) use of aspirin: Secondary | ICD-10-CM | POA: Insufficient documentation

## 2020-11-21 DIAGNOSIS — Z87891 Personal history of nicotine dependence: Secondary | ICD-10-CM | POA: Insufficient documentation

## 2020-11-21 DIAGNOSIS — Z7984 Long term (current) use of oral hypoglycemic drugs: Secondary | ICD-10-CM | POA: Diagnosis not present

## 2020-11-21 DIAGNOSIS — M47892 Other spondylosis, cervical region: Secondary | ICD-10-CM | POA: Diagnosis not present

## 2020-11-21 DIAGNOSIS — M542 Cervicalgia: Secondary | ICD-10-CM | POA: Diagnosis not present

## 2020-11-21 DIAGNOSIS — R0602 Shortness of breath: Secondary | ICD-10-CM | POA: Diagnosis not present

## 2020-11-21 DIAGNOSIS — I4430 Unspecified atrioventricular block: Secondary | ICD-10-CM | POA: Diagnosis not present

## 2020-11-21 DIAGNOSIS — M5481 Occipital neuralgia: Secondary | ICD-10-CM | POA: Diagnosis not present

## 2020-11-21 DIAGNOSIS — R21 Rash and other nonspecific skin eruption: Secondary | ICD-10-CM | POA: Diagnosis not present

## 2020-11-21 LAB — CBC
HCT: 43.5 % (ref 39.0–52.0)
Hemoglobin: 14.5 g/dL (ref 13.0–17.0)
MCH: 29.4 pg (ref 26.0–34.0)
MCHC: 33.3 g/dL (ref 30.0–36.0)
MCV: 88.2 fL (ref 80.0–100.0)
Platelets: 215 10*3/uL (ref 150–400)
RBC: 4.93 MIL/uL (ref 4.22–5.81)
RDW: 11.9 % (ref 11.5–15.5)
WBC: 7.8 10*3/uL (ref 4.0–10.5)
nRBC: 0 % (ref 0.0–0.2)

## 2020-11-21 LAB — TROPONIN I (HIGH SENSITIVITY)
Troponin I (High Sensitivity): 4 ng/L (ref <18)
Troponin I (High Sensitivity): 11 ng/L (ref <18)

## 2020-11-21 LAB — BASIC METABOLIC PANEL
Anion gap: 14 (ref 5–15)
BUN: 9 mg/dL (ref 8–23)
CO2: 25 mmol/L (ref 22–32)
Calcium: 9.3 mg/dL (ref 8.9–10.3)
Chloride: 95 mmol/L — ABNORMAL LOW (ref 98–111)
Creatinine, Ser: 0.82 mg/dL (ref 0.61–1.24)
GFR, Estimated: >60 mL/min (ref >60)
Glucose, Bld: 213 mg/dL — ABNORMAL HIGH (ref 70–99)
Potassium: 3.8 mmol/L (ref 3.5–5.1)
Sodium: 134 mmol/L — ABNORMAL LOW (ref 135–145)

## 2020-11-21 NOTE — ED Triage Notes (Signed)
Pt arrives via [facility] from home. Pt reported to EMS he was making coffee this afternoon, onset of left upper chest pain- sharp, piercing, intermittent. Has had exertional SOB. 324 ASA taken PTA of EMS. Denies pain on EMS arrival. 12 lead showed 1st degree block. 174/94, hr 100, cbg 171, 100% RA.

## 2020-11-21 NOTE — ED Triage Notes (Signed)
Pt reports onset of left upper chest pain several hours ago, now subsided.

## 2020-11-22 DIAGNOSIS — F419 Anxiety disorder, unspecified: Secondary | ICD-10-CM | POA: Diagnosis not present

## 2020-11-22 DIAGNOSIS — E1165 Type 2 diabetes mellitus with hyperglycemia: Secondary | ICD-10-CM | POA: Diagnosis not present

## 2020-11-22 DIAGNOSIS — Z659 Problem related to unspecified psychosocial circumstances: Secondary | ICD-10-CM | POA: Diagnosis not present

## 2020-11-22 DIAGNOSIS — R079 Chest pain, unspecified: Secondary | ICD-10-CM | POA: Diagnosis not present

## 2020-11-22 DIAGNOSIS — I10 Essential (primary) hypertension: Secondary | ICD-10-CM | POA: Diagnosis not present

## 2020-11-22 MED ORDER — IBUPROFEN 400 MG PO TABS
400.0000 mg | ORAL_TABLET | Freq: Once | ORAL | Status: AC
  Administered 2020-11-22: 400 mg via ORAL
  Filled 2020-11-22: qty 1

## 2020-11-22 MED ORDER — OXYCODONE-ACETAMINOPHEN 5-325 MG PO TABS
1.0000 | ORAL_TABLET | Freq: Once | ORAL | Status: AC
  Administered 2020-11-22: 1 via ORAL
  Filled 2020-11-22: qty 1

## 2020-11-22 NOTE — Medical Student Note (Signed)
[facility]-EMERGENCY DEPT Provider Student Note For educational purposes for Medical, PA and NP students only and not part of the legal medical record.   CSN: [id number] Arrival date & time: 11/21/20  1936      History   Chief Complaint Chief Complaint  Patient presents with  . Chest Pain    HPI Mike Hart is a 65 y.o. male with PMH HTN, HLD, DM and per patient recent diagnosis of neuropathy placed on Lyrica.   HPI obtained by chart review and patient. Per chart he arrived via EMS from home for sudden onset left upper chest pain that was sharp. States he also had shortness of breath. Was given 324 ASA with resolution of chest pain by arrival to ED.  Per patient he had a doctors appointment this morning and his blood pressure was normal. States when he got home, this afternoon had SBP of 190. He began having left upper chest pain at this time. Describes it as intermittent and piercing. States he had associated shortness of breath and subjective palpitations. Denies nausea or diaphoresis with event.   At time of interview his pain has resolved. He appears more concerned with his neuropathy and if his Lyrica had anything to do with his symptoms on interview.   No fevers, travel, recent illness or sick contacts.  Past Medical History:  Diagnosis Date  . Diabetes mellitus without complication (HCC)   . Hyperlipidemia   . Hypertension     Patient Active Problem List   Diagnosis Date Noted  . Diabetes (HCC) 05/07/2013    History reviewed. No pertinent surgical history.   Home Medications    Prior to Admission medications   Medication Sig Start Date End Date Taking? Authorizing Provider  aspirin 81 MG tablet Take 81 mg by mouth daily.    [provider]  atorvastatin (LIPITOR) 20 MG tablet Take 20 mg by mouth daily.    [provider]  fluticasone (FLONASE) 50 MCG/ACT nasal spray Place 1 spray into both nostrils daily. For seasonal allergies    [provider]  lisinopril-hydrochlorothiazide (PRINZIDE,ZESTORETIC) 20-25 MG per tablet Take 1 tablet by mouth daily.    [provider]  metFORMIN (GLUCOPHAGE) 1000 MG tablet Take 1 tablet (1000 mg) by mouth every morning and 1 and 1/2 tablets (1500 mg) every evening    [provider]  Multiple Vitamins-Minerals (MULTIVITAMIN WITH MINERALS) tablet Take 1 tablet by mouth daily.    [provider]  traMADol (ULTRAM) 50 MG tablet Take 50 mg by mouth daily as needed.    [provider]   Family History No family history on file.  Social History Social History   Tobacco Use  . Smoking status: Former Smoker    Quit date: 01/01/1995    Years since quitting: 25.9  . Smokeless tobacco: Never Used  Substance Use Topics  . Alcohol use: No    Allergies   Patient has no known allergies.   Review of Systems Review of Systems  Constitutional: Negative for diaphoresis, fatigue and fever.  HENT: Negative for sore throat.   Eyes: Negative.   Respiratory: Positive for shortness of breath. Negative for cough and chest tightness.   Cardiovascular: Positive for chest pain and palpitations. Negative for leg swelling.  Gastrointestinal: Negative for nausea and vomiting.  Endocrine: Negative.   Genitourinary: Negative.   Musculoskeletal: Negative for back pain and neck pain.  Skin: Negative.   Allergic/Immunologic: Negative.   Neurological: Negative for dizziness and  light-headedness.  Hematological: Negative.   Psychiatric/Behavioral: Negative.    Physical Exam Updated Vital Signs BP (!) 160/85 (BP Location: Right Arm)   Pulse 75   Temp 97.7 F (36.5 C) (Oral)   Resp 16   SpO2 100%   Physical Exam Constitutional:      General: He is not in acute distress.    Appearance: He is not toxic-appearing.  HENT:     Head: Normocephalic.  Eyes:     Extraocular Movements: Extraocular movements intact.     Pupils: Pupils are equal, round, and reactive to  light.  Neck:     Vascular: No JVD.  Cardiovascular:     Rate and Rhythm: Normal rate and regular rhythm.     Pulses:          Radial pulses are 2+ on the right side and 2+ on the left side.     Heart sounds: Normal heart sounds.  Pulmonary:     Effort: Pulmonary effort is normal.     Breath sounds: Normal breath sounds.  Chest:     Chest wall: No tenderness.  Abdominal:     General: Bowel sounds are normal.     Palpations: Abdomen is soft.  Musculoskeletal:     Cervical back: Normal range of motion.     Right lower leg: No edema.     Left lower leg: No edema.  Skin:    General: Skin is warm and dry.     Capillary Refill: Capillary refill takes less than 2 seconds.  Neurological:     General: No focal deficit present.     Mental Status: He is alert.  Psychiatric:        Mood and Affect: Mood normal.    ED Treatments / Results  Labs (all labs ordered are listed, but only abnormal results are displayed) Labs Reviewed  BASIC METABOLIC PANEL - Abnormal; Notable for the following components:      Result Value   Sodium 134 (*)    Chloride 95 (*)    Glucose, Bld 213 (*)    All other components within normal limits  CBC  TROPONIN I (HIGH SENSITIVITY)  TROPONIN I (HIGH SENSITIVITY)   EKG  Radiology DG Chest 2 View  Result Date: 11/21/2020 CLINICAL DATA:  65 year old male with chest wall pain. EXAM: CHEST - 2 VIEW COMPARISON:  None. FINDINGS: The lungs are clear. There is no pleural effusion pneumothorax. The cardiac silhouette is within limits. No acute osseous pathology. Density over the lateral aspect of the left ninth rib on the frontal view may be overlying the patient or represent an age indeterminate fracture. Correlation with point tenderness recommended. IMPRESSION: No acute cardiopulmonary process. Electronically Signed   By: Elgie Collard M.D.   On: 11/21/2020 20:09   Procedures Procedures (including critical care time)  Medications Ordered in ED Medications  - No data to display  Initial Impression / Assessment and Plan / ED Course  I have reviewed the triage vital signs and the nursing notes.  Pertinent labs & imaging results that were available during my care of the patient were reviewed by me and considered in my medical decision making (see chart for details).  Ayub Kirsh is a 14yoM with PMH HTN, HLD, DM who presents to ED for chest pain. Differential diagnosis includes NSTEMI, pulmonary embolism, musculoskeletal pain, infection or chest pain from intermittent HTN.   At this time NSTEMI is not likely given physical exam, EKG at baseline and troponin x2  negative.  Unlikely to be PE given he is not tachycardic, febrile, short of breath on exam. There is no lower extremity edema or swelling. Not hypoxic.  Unlikely to be infection. No WBC, cough, fevers, and CXR unremarkable.   After work-up, likely either musculoskeletal or due to intermittent high blood pressures. Will encourage OTC pain relief and to have close follow-up with PCP and cardiologist to titrate BP medication.   Final Clinical Impressions(s) / ED Diagnoses   Final diagnoses:  None   New Prescriptions New Prescriptions   No medications on file

## 2020-11-23 NOTE — ED Provider Notes (Signed)
[facility] EMERGENCY DEPARTMENT Provider Note   CSN: [id number] Arrival date & time: 11/21/20  1936     History Chief Complaint  Patient presents with  . Chest Pain    Mike Hart is a 65 y.o. male.  Initially with some sharp left-sided retrosternal chest pain.  No radiation.  No shortness of breath nausea vomiting diaphoresis or lightheadedness.  This is resolved this time he just wants to talk about how his neuropathy in his left leg is appropriately treated with his Lyrica.  This is been going on for many months and is not the reason why he presented here however he perseverates on talking about it.  It is not swollen.  It has been worked up extensively by his primary doctor per his report   Chest Pain      Past Medical History:  Diagnosis Date  . Diabetes mellitus without complication (HCC)   . Hyperlipidemia   . Hypertension     Patient Active Problem List   Diagnosis Date Noted  . Diabetes (HCC) 05/07/2013    History reviewed. No pertinent surgical history.     No family history on file.  Social History   Tobacco Use  . Smoking status: Former Smoker    Quit date: 01/01/1995    Years since quitting: 25.9  . Smokeless tobacco: Never Used  Substance Use Topics  . Alcohol use: No    Home Medications Prior to Admission medications   Medication Sig Start Date End Date Taking? Authorizing Provider  aspirin 81 MG tablet Take 81 mg by mouth daily.    [provider]  atorvastatin (LIPITOR) 20 MG tablet Take 20 mg by mouth daily.    [provider]  fluticasone (FLONASE) 50 MCG/ACT nasal spray Place 1 spray into both nostrils daily. For seasonal allergies    [provider]  lisinopril-hydrochlorothiazide (PRINZIDE,ZESTORETIC) 20-25 MG per tablet Take 1 tablet by mouth daily.    [provider]  metFORMIN (GLUCOPHAGE) 1000 MG tablet Take 1 tablet (1000 mg) by mouth every morning and 1 and 1/2 tablets  (1500 mg) every evening    [provider]  Multiple Vitamins-Minerals (MULTIVITAMIN WITH MINERALS) tablet Take 1 tablet by mouth daily.    [provider]  traMADol (ULTRAM) 50 MG tablet Take 50 mg by mouth daily as needed.    [provider]    Allergies    Patient has no known allergies.  Review of Systems   Review of Systems  Cardiovascular: Positive for chest pain.  All other systems reviewed and are negative.   Physical Exam Updated Vital Signs BP 136/83   Pulse 81   Temp 97.7 F (36.5 C) (Oral)   Resp 13   SpO2 100%   Physical Exam Vitals and nursing note reviewed.  Constitutional:      Appearance: He is well-developed and well-nourished.  HENT:     Head: Normocephalic and atraumatic.     Nose: Nose normal. No congestion or rhinorrhea.     Mouth/Throat:     Mouth: Mucous membranes are moist.     Pharynx: Oropharynx is clear.  Eyes:     Pupils: Pupils are equal, round, and reactive to light.  Cardiovascular:     Rate and Rhythm: Normal rate.  Pulmonary:     Effort: Pulmonary effort is normal. No respiratory distress.  Abdominal:     General: Abdomen is flat. There is no distension.  Musculoskeletal:  General: Normal range of motion.     Cervical back: Normal range of motion.  Skin:    General: Skin is warm and dry.  Neurological:     General: No focal deficit present.     Mental Status: He is alert.     ED Results / Procedures / Treatments   Labs (all labs ordered are listed, but only abnormal results are displayed) Labs Reviewed  BASIC METABOLIC PANEL - Abnormal; Notable for the following components:      Result Value   Sodium 134 (*)    Chloride 95 (*)    Glucose, Bld 213 (*)    All other components within normal limits  CBC  TROPONIN I (HIGH SENSITIVITY)  TROPONIN I (HIGH SENSITIVITY)    EKG EKG Interpretation  Date/Time:  Tuesday November 21 2020 19:45:19 EST Ventricular Rate:  90 PR Interval:  242 QRS  Duration: 78 QT Interval:  366 QTC Calculation: 447 R Axis:   27 Text Interpretation: Sinus rhythm with 1st degree A-V block Otherwise normal ECG No old tracing to compare Confirmed by Dione Booze ([id number]) on 11/21/2020 11:28:49 PM   Radiology DG Chest 2 View  Result Date: 11/21/2020 CLINICAL DATA:  65 year old male with chest wall pain. EXAM: CHEST - 2 VIEW COMPARISON:  None. FINDINGS: The lungs are clear. There is no pleural effusion pneumothorax. The cardiac silhouette is within limits. No acute osseous pathology. Density over the lateral aspect of the left ninth rib on the frontal view may be overlying the patient or represent an age indeterminate fracture. Correlation with point tenderness recommended. IMPRESSION: No acute cardiopulmonary process. Electronically Signed   By: Elgie Collard M.D.   On: 11/21/2020 20:09    Procedures Procedures   Medications Ordered in ED Medications  ibuprofen (ADVIL) tablet 400 mg (400 mg Oral Given 11/22/20 2409)  oxyCODONE-acetaminophen (PERCOCET/ROXICET) 5-325 MG per tablet 1 tablet (1 tablet Oral Given 11/22/20 7353)    ED Course  I have reviewed the triage vital signs and the nursing notes.  Pertinent labs & imaging results that were available during my care of the patient were reviewed by me and considered in my medical decision making (see chart for details).    MDM Rules/Calculators/A&P                          Chest pain resolved.  Patient with neuropathy but will need primary care follow-up for that.  Low suspicion for ACS.  Low suspicion for PE currently.   Final Clinical Impression(s) / ED Diagnoses Final diagnoses:  Nonspecific chest pain  Hypertension, unspecified type    Rx / DC Orders ED Discharge Orders    None       [place] Lasseter, Barbara Cower, MD 11/23/20 [phone]

## 2020-11-24 ENCOUNTER — Telehealth: Payer: Self-pay

## 2020-11-24 NOTE — Telephone Encounter (Signed)
NOTES ON FILE FROM [facility] [phone], SENT REFERRAL TO SCHEDULING 

## 2020-11-27 DIAGNOSIS — Z6823 Body mass index (BMI) 23.0-23.9, adult: Secondary | ICD-10-CM | POA: Diagnosis not present

## 2020-11-27 DIAGNOSIS — M545 Low back pain, unspecified: Secondary | ICD-10-CM | POA: Diagnosis not present

## 2020-11-27 DIAGNOSIS — M4716 Other spondylosis with myelopathy, lumbar region: Secondary | ICD-10-CM | POA: Diagnosis not present

## 2020-11-27 DIAGNOSIS — M5481 Occipital neuralgia: Secondary | ICD-10-CM | POA: Diagnosis not present

## 2020-11-27 DIAGNOSIS — I10 Essential (primary) hypertension: Secondary | ICD-10-CM | POA: Diagnosis not present

## 2020-11-29 ENCOUNTER — Other Ambulatory Visit: Payer: Self-pay | Admitting: Rehabilitation

## 2020-11-29 DIAGNOSIS — M4716 Other spondylosis with myelopathy, lumbar region: Secondary | ICD-10-CM

## 2020-12-07 ENCOUNTER — Other Ambulatory Visit: Payer: Self-pay

## 2020-12-07 ENCOUNTER — Ambulatory Visit: Payer: Medicare HMO | Admitting: Cardiology

## 2020-12-07 ENCOUNTER — Encounter: Payer: Self-pay | Admitting: Cardiology

## 2020-12-07 VITALS — BP 138/80 | HR 92 | Ht 71.0 in | Wt 162.8 lb

## 2020-12-07 DIAGNOSIS — R0602 Shortness of breath: Secondary | ICD-10-CM | POA: Diagnosis not present

## 2020-12-07 DIAGNOSIS — R072 Precordial pain: Secondary | ICD-10-CM

## 2020-12-07 DIAGNOSIS — I10 Essential (primary) hypertension: Secondary | ICD-10-CM | POA: Diagnosis not present

## 2020-12-07 DIAGNOSIS — E119 Type 2 diabetes mellitus without complications: Secondary | ICD-10-CM | POA: Diagnosis not present

## 2020-12-07 DIAGNOSIS — E785 Hyperlipidemia, unspecified: Secondary | ICD-10-CM | POA: Insufficient documentation

## 2020-12-07 DIAGNOSIS — R079 Chest pain, unspecified: Secondary | ICD-10-CM | POA: Diagnosis not present

## 2020-12-07 DIAGNOSIS — E7800 Pure hypercholesterolemia, unspecified: Secondary | ICD-10-CM | POA: Diagnosis not present

## 2020-12-07 DIAGNOSIS — G8929 Other chronic pain: Secondary | ICD-10-CM | POA: Insufficient documentation

## 2020-12-07 MED ORDER — METOPROLOL TARTRATE 100 MG PO TABS: 100.0000 mg | ORAL_TABLET | Freq: Once | ORAL | 0 refills | Status: DC

## 2020-12-07 NOTE — Patient Instructions (Addendum)
Medication Instructions:  Your physician recommends that you continue on your current medications as directed. Please refer to the Current Medication list given to you today.  *If you need a refill on your cardiac medications before your next appointment, please call your pharmacy*   Lab Work: None today If you have labs (blood work) drawn today and your tests are completely normal, you will receive your results only by: Marland Kitchen MyChart Message (if you have MyChart) OR . A paper copy in the mail If you have any lab test that is abnormal or we need to change your treatment, we will call you to review the results.   Testing/Procedures: Your physician has requested that you have an echocardiogram. Echocardiography is a painless test that uses sound waves to create images of your heart. It provides your doctor with information about the size and shape of your heart and how well your heart's chambers and valves are working. This procedure takes approximately one hour. There are no restrictions for this procedure.  Your physician has requested you have a coronary CT performed.    Follow-Up: At [facility], you and your health needs are our priority.  As part of our continuing mission to provide you with exceptional heart care, we have created designated Provider Care Teams.  These Care Teams include your primary Cardiologist (physician) and Advanced Practice Providers (APPs -  Physician Assistants and Nurse Practitioners) who all work together to provide you with the care you need, when you need it.  We recommend signing up for the patient portal called "MyChart".  Sign up information is provided on this After Visit Summary.  MyChart is used to connect with patients for Virtual Visits (Telemedicine).  Patients are able to view lab/test results, encounter notes, upcoming appointments, etc.  Non-urgent messages can be sent to your provider as well.   To learn more about what you can do with MyChart, go  to [email or web address].    Your next appointment:   As needed based on testing   The format for your next appointment:   In Person  Provider:   Armanda Magic, MD   Other Instructions Your cardiac CT will be scheduled at one of the below locations:   [facility] [address] [place], [place] [address] [phone]  If scheduled at [facility], please arrive at the [facility] main entrance (entrance A) of [facility] 30 minutes prior to test start time. Proceed to the [facility] Radiology Department (first floor) to check-in and test prep.  If scheduled at [facility], please arrive 15 mins early for check-in and test prep.  Please follow these instructions carefully (unless otherwise directed):  Hold all erectile dysfunction medications at least 3 days (72 hrs) prior to test.  On the Night Before the Test: . Be sure to Drink plenty of water. . Do not consume any caffeinated/decaffeinated beverages or chocolate 12 hours prior to your test. . Do not take any antihistamines 12 hours prior to your test. On the Day of the Test: . Drink plenty of water until 1 hour prior to the test. . Do not eat any food 4 hours prior to the test. . You may take your regular medications prior to the test.  . Take metoprolol (Lopressor) two hours prior to test. . HOLD Furosemide/Hydrochlorothiazide morning of the test.        After the Test: . Drink plenty of water. . After receiving IV contrast, you may  experience a mild flushed feeling. This is normal. . On occasion, you may experience a mild rash up to 24 hours after the test. This is not dangerous. If this occurs, you can take Benadryl 25 mg and increase your fluid intake. . If you experience trouble breathing, this can be serious. If it is severe call 911 IMMEDIATELY. If it is mild, please call our office. . If you take any of these medications: Glipizide/Metformin, Avandament,  Glucavance, please do not take 48 hours after completing test unless otherwise instructed.   Once we have confirmed authorization from your insurance company, we will call you to set up a date and time for your test. Based on how quickly your insurance processes prior authorizations requests, please allow up to 4 weeks to be contacted for scheduling your Cardiac CT appointment. Be advised that routine Cardiac CT appointments could be scheduled as many as 8 weeks after your provider has ordered it.  For non-scheduling related questions, please contact the cardiac imaging nurse navigator should you have any questions/concerns: Rockwell Alexandria, Cardiac Imaging Nurse Navigator Larey Brick, Cardiac Imaging Nurse Navigator [facility] Direct Office Dial: [phone]   For scheduling needs, including cancellations and rescheduling, please call [place], [phone].

## 2020-12-07 NOTE — Progress Notes (Signed)
Cardiology Consult Note    Date:  12/07/2020   ID:  Mike Hart, DOB 1956/03/03, MRN [id number]  PCP:  Mike Bussing, MD  Cardiologist:  Armanda Magic, MD   Chief Complaint  Patient presents with  . New Patient (Initial Visit)    Chest pain     History of Present Illness:  Mike Hart is a 65 y.o. male who is being seen today for the evaluation of chest pain at the request of Mike, Dibas, MD.  This is a 65yo male with a hx of DM, HLD and HTN.  He was seen in the ER 11/22/20 with chest pain and is now referred for further evaluation.  The day of ER visit he has sudden onset of sharp left sided midsternal chest pain with no radiation, SOB, N, Vomiting, diaphoresis or lightheadedness. Apparently by the time he was evaluated his CP was gone and he only wanted to talk about his LE neuropathy which had been worked up extensively by his PCP.  He has a 25 pack year hx of smoking and stopped in 1996.  EKG showed no acute ST changes. hsTrop was 4 and 11, BS 213 and otherwise normal labs and chest xray.    He is here today for evaluation and tells me that he has not had any further episodes of chest pain but has had problems with SOB and his BP has been running high.    Past Medical History:  Diagnosis Date  . Chronic pain   . Diabetes mellitus without complication (HCC)   . Hyperlipidemia   . Hypertension     History reviewed. No pertinent surgical history.  Current Medications: Current Meds  Medication Sig  . atorvastatin (LIPITOR) 20 MG tablet Take 20 mg by mouth daily.  . cetirizine (ZYRTEC) 10 MG tablet Take 10 mg by mouth daily.  . fluticasone (FLONASE) 50 MCG/ACT nasal spray Place 1 spray into both nostrils daily. For seasonal allergies  . lisinopril-hydrochlorothiazide (PRINZIDE,ZESTORETIC) 20-25 MG per tablet Take 1 tablet by mouth daily.  . metFORMIN (GLUCOPHAGE) 1000 MG tablet Take 1 tablet (1000 mg) by mouth every morning and 1 and 1/2 tablets (1500 mg)  every evening  . Multiple Vitamins-Minerals (MULTIVITAMIN WITH MINERALS) tablet Take 1 tablet by mouth daily.  . pregabalin (LYRICA) 50 MG capsule Take 1 capsule by mouth as directed. TAKE ONE CAPSULE BY MOUTH EVERY NIGHT AT BEDTIME FOR 7 NIGHT THEN 2 CAPSULES EVERY NIGHT AT BEDTIME  . [DISCONTINUED] aspirin 81 MG tablet Take 81 mg by mouth daily.  . [DISCONTINUED] traMADol (ULTRAM) 50 MG tablet Take 50 mg by mouth daily as needed.    Allergies:   Patient has no known allergies.   Social History   Socioeconomic History  . Marital status: Single    Spouse name: Not on file  . Number of children: Not on file  . Years of education: Not on file  . Highest education level: Not on file  Occupational History  . Not on file  Tobacco Use  . Smoking status: Former Smoker    Quit date: 01/01/1995    Years since quitting: 25.9  . Smokeless tobacco: Never Used  Substance and Sexual Activity  . Alcohol use: No  . Drug use: Not on file  . Sexual activity: Not on file  Other Topics Concern  . Not on file  Social History Narrative  . Not on file   Social Determinants of Health   Financial Resource Strain: Not on  file  Food Insecurity: Not on file  Transportation Needs: Not on file  Physical Activity: Not on file  Stress: Not on file  Social Connections: Not on file     Family History:  The patient's family history includes Lung cancer in his mother.   ROS:   Please see the history of present illness.    ROS All other systems reviewed and are negative.  No flowsheet data found.     PHYSICAL EXAM:   VS:  BP 138/80   Pulse 92   Ht 5\' 11"  (1.803 m)   Wt 162 lb 12.8 oz (73.8 kg)   SpO2 94%   BMI 22.71 kg/m    GEN: Well nourished, well developed, in no acute distress  HEENT: normal  Neck: no JVD, carotid bruits, or masses Cardiac: RRR; no murmurs, rubs, or gallops,no edema.  Intact distal pulses bilaterally.  Respiratory:  clear to auscultation bilaterally, normal work of  breathing GI: soft, nontender, nondistended, + BS MS: no deformity or atrophy  Skin: warm and dry, no rash Neuro:  Alert and Oriented x 3, Strength and sensation are intact Psych: euthymic mood, full affect  Wt Readings from Last 3 Encounters:  12/07/20 162 lb 12.8 oz (73.8 kg)  05/16/17 159 lb (72.1 kg)  08/20/16 160 lb (72.6 kg)      Studies/Labs Reviewed:   EKG:  EKG is not ordered today.    Recent Labs: 11/21/2020: BUN 9; Creatinine, Ser 0.82; Hemoglobin 14.5; Platelets 215; Potassium 3.8; Sodium 134   Lipid Panel No results found for: CHOL, TRIG, HDL, CHOLHDL, VLDL, LDLCALC, LDLDIRECT   Additional studies/ records that were reviewed today include:  ER records, EKG, labs    ASSESSMENT:    1. Chest pain of uncertain etiology   2. Primary hypertension   3. Pure hypercholesterolemia   4. DM type 2, goal HbA1c < 7% (HCC)   5. SOB (shortness of breath)      PLAN:  In order of problems listed above:  1.  Chest pain -atypical in presentation and no associated symptoms -he runs for exercise and does not get CP -EKG was nonischemic and hsTrop normal although delta was > 5 -he does have CRFs including remote tobacco use, DM, HLD and HTN -I will get a coronary CTA to define coronary anatomy  2.  HTN -BP controlled on exam today -continue Lisinopril HCT 20-25mg  daily  3.  HLD -LDL goal < 100 -followed by PCP -LDL was 87 in Nov 2021 -continue atorvastatin 20mg  daily  4.  DOE -this seems to be chronic and may be related allergies and prior tobacco use -when he treats his allergies his SOB resolves -check 2D echo to assess LVF and diastolic function   Medication Adjustments/Labs and Tests Ordered: Current medicines are reviewed at length with the patient today.  Concerns regarding medicines are outlined above.  Medication changes, Labs and Tests ordered today are listed in the Patient Instructions below.  There are no Patient Instructions on file for this  visit.   Signed, Dec 2021, MD  12/07/2020 12:33 PM    [facility] [address] [place], [place], [place]  [place] Phone: [phone]; Fax: [phone]

## 2020-12-07 NOTE — Addendum Note (Signed)
Addended by: Terrilyn Saver on: 12/07/2020 12:45 PM   Modules accepted: Orders

## 2020-12-14 ENCOUNTER — Ambulatory Visit
Admission: RE | Admit: 2020-12-14 | Discharge: 2020-12-14 | Disposition: A | Discharge status: Home / Self Care | Payer: Medicare HMO | Source: Ambulatory Visit | Attending: Rehabilitation | Admitting: Rehabilitation

## 2020-12-14 ENCOUNTER — Telehealth (HOSPITAL_COMMUNITY): Payer: Self-pay | Admitting: *Deleted

## 2020-12-14 ENCOUNTER — Other Ambulatory Visit: Payer: Self-pay

## 2020-12-14 DIAGNOSIS — M4716 Other spondylosis with myelopathy, lumbar region: Secondary | ICD-10-CM

## 2020-12-14 DIAGNOSIS — M545 Low back pain, unspecified: Secondary | ICD-10-CM | POA: Diagnosis not present

## 2020-12-14 NOTE — Telephone Encounter (Signed)
Reaching out to patient to offer assistance regarding upcoming cardiac imaging study; pt verbalizes understanding of appt date/time, parking situation and where to check in, pre-test NPO status and medications ordered, and verified current allergies; name and call back number provided for further questions should they arise  Neeka Urista RN Navigator Cardiac Imaging [facility] [phone] office [phone] cell  

## 2020-12-15 ENCOUNTER — Ambulatory Visit (HOSPITAL_COMMUNITY)
Admission: RE | Admit: 2020-12-15 | Discharge: 2020-12-15 | Disposition: A | Discharge status: Home / Self Care | Payer: Medicare HMO | Source: Ambulatory Visit | Attending: Cardiology | Admitting: Cardiology

## 2020-12-15 DIAGNOSIS — Z006 Encounter for examination for normal comparison and control in clinical research program: Secondary | ICD-10-CM

## 2020-12-15 DIAGNOSIS — R072 Precordial pain: Secondary | ICD-10-CM | POA: Insufficient documentation

## 2020-12-15 MED ORDER — IOHEXOL 350 MG/ML SOLN
80.0000 mL | Freq: Once | INTRAVENOUS | Status: AC | PRN
  Administered 2020-12-15: 80 mL via INTRAVENOUS

## 2020-12-15 MED ORDER — NITROGLYCERIN 0.4 MG SL SUBL
0.8000 mg | SUBLINGUAL_TABLET | Freq: Once | SUBLINGUAL | Status: AC
  Administered 2020-12-15: 0.8 mg via SUBLINGUAL

## 2020-12-15 MED ORDER — NITROGLYCERIN 0.4 MG SL SUBL
SUBLINGUAL_TABLET | SUBLINGUAL | Status: AC
  Filled 2020-12-15: qty 2

## 2020-12-15 NOTE — Research (Signed)
IDENTIFY Informed Consent                  Subject Name: Mike Hart    Subject met inclusion and exclusion criteria.  The informed consent form, study requirements and expectations were reviewed with the subject and questions and concerns were addressed prior to the signing of the consent form.  The subject verbalized understanding of the trial requirements.  The subject agreed to participate in the IDENTIFY trial and signed the informed consent at 11:50AM on 12/15/20.  The informed consent was obtained prior to performance of any protocol-specific procedures for the subject.  A copy of the signed informed consent was given to the subject and a copy was placed in the subject's medical record.   Meade Maw, Naval architect

## 2020-12-18 ENCOUNTER — Encounter: Payer: Self-pay | Admitting: Cardiology

## 2020-12-18 DIAGNOSIS — I700 Atherosclerosis of aorta: Secondary | ICD-10-CM | POA: Insufficient documentation

## 2020-12-19 IMAGING — MR MR CERVICAL SPINE W/O CM
5 series · 36 of 48 positions shown · non-contrast
Comparison: Prior MRI from 08/28/2015.

CLINICAL DATA: Initial evaluation for chronic left-sided neck pain
for several years.

EXAM:
MRI CERVICAL SPINE WITHOUT CONTRAST
TECHNIQUE: Multiplanar, multisequence MR imaging of the cervical spine was
performed. No intravenous contrast was administered.

[Series 2: T2 · sagittal · 3.0mm · 0.41mm/px · 8 of 17 slices shown (1 of 2)]
[im 1/17]
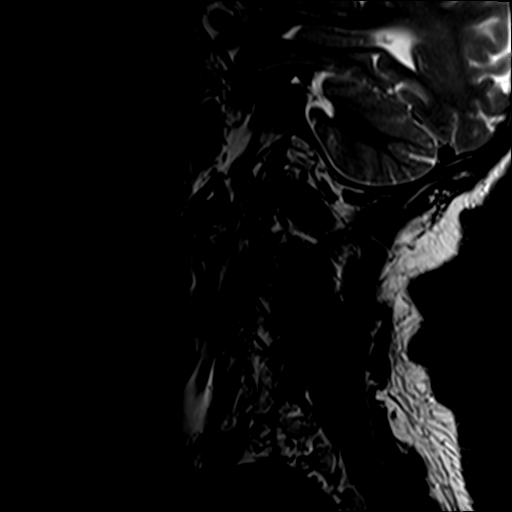
[im 3/17]
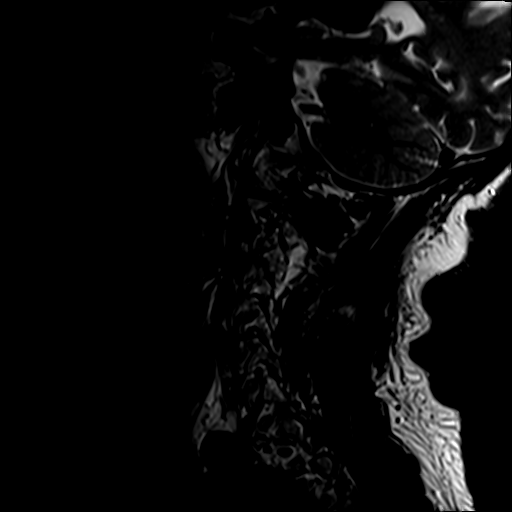
[im 5/17]
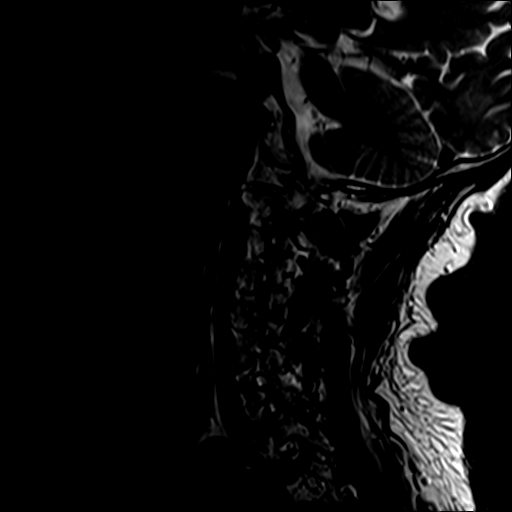
[im 7/17]
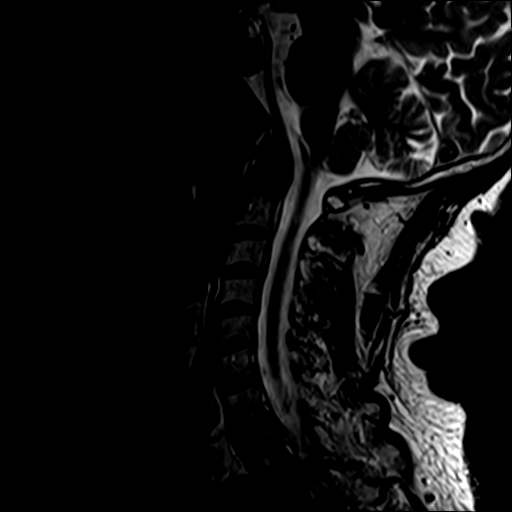
[im 10/17]
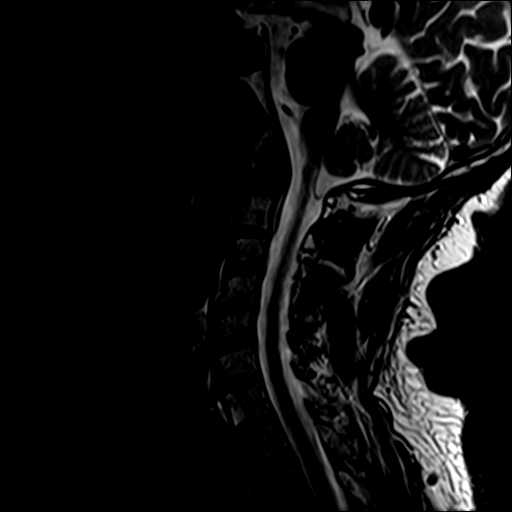
[im 12/17]
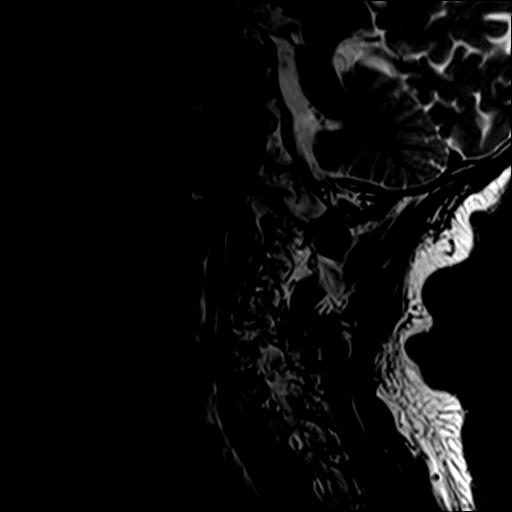
[im 14/17]
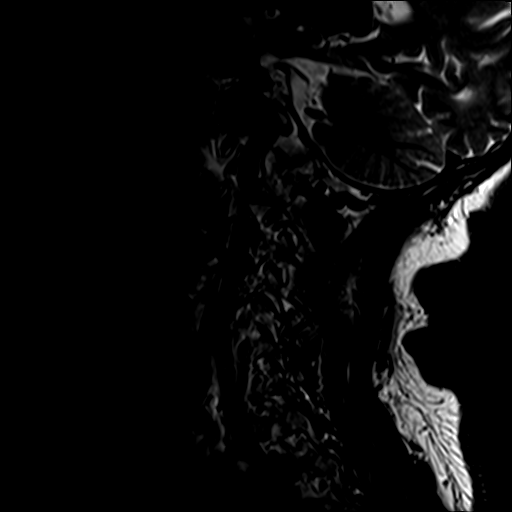
[im 17/17]
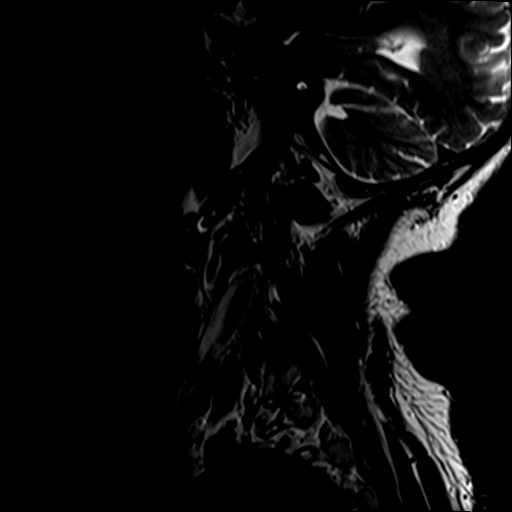

[Series 3: STIR · sagittal · 3.0mm · 0.82mm/px · 8 of 17 slices shown]
[im 1/17]
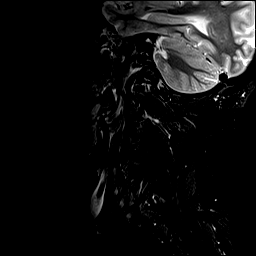
[im 3/17]
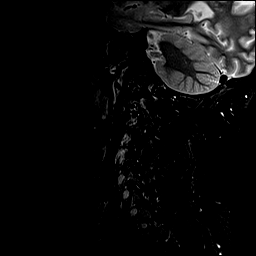
[im 5/17]
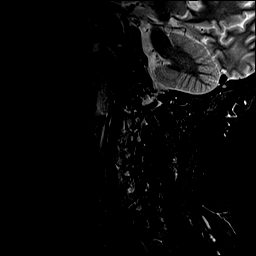
[im 7/17]
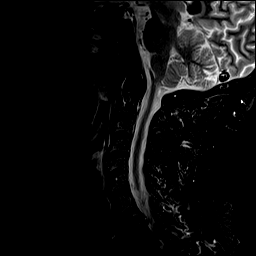
[im 10/17]
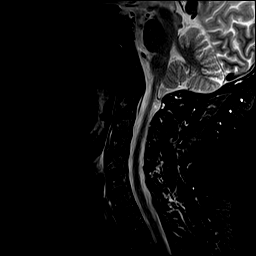
[im 12/17]
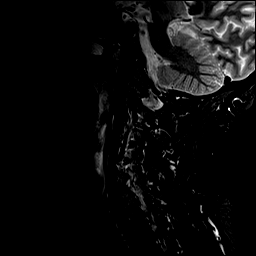
[im 14/17]
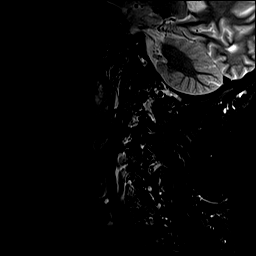
[im 17/17]
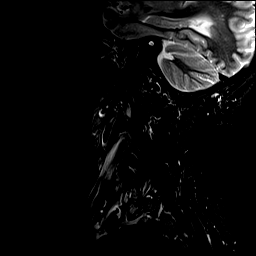

[Series 4: T1 · sagittal · 3.0mm · 0.82mm/px · 8 of 17 slices shown]
[im 1/17]
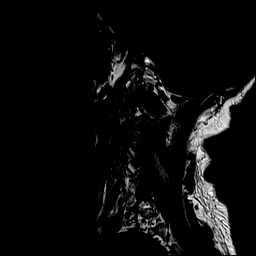
[im 3/17]
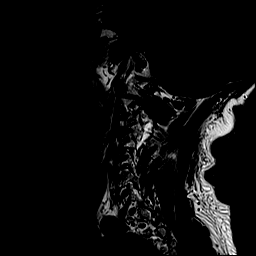
[im 5/17]
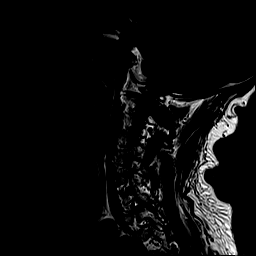
[im 7/17]
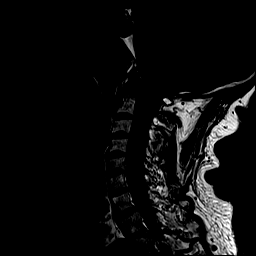
[im 10/17]
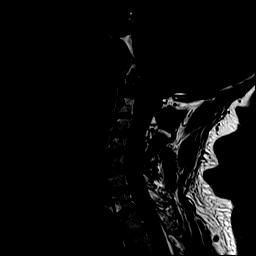
[im 12/17]
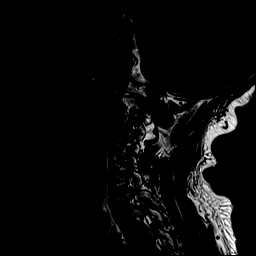
[im 14/17]
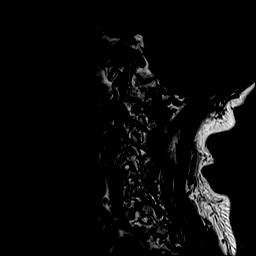
[im 17/17]
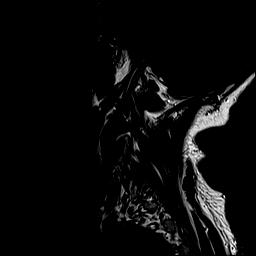

[Series 5: T2 · axial · 3.0mm · 0.70mm/px · z∈[-51,+44]mm · 9 of 27 slices shown (2 of 2)]
[im 1/27]
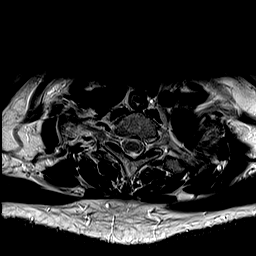
[im 5/27]
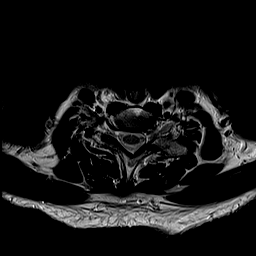
[im 8/27]
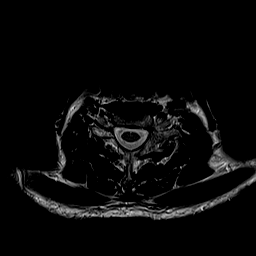
[im 12/27]
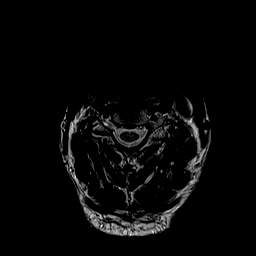
[im 15/27]
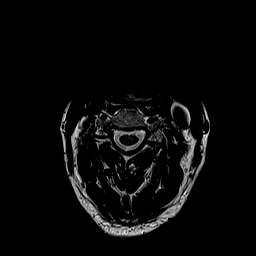
[im 19/27]
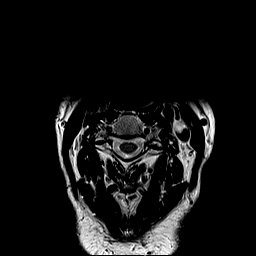
[im 22/27]
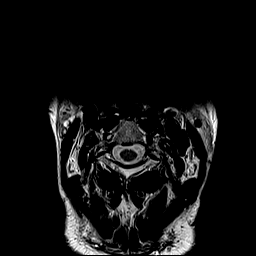
[im 24/27]
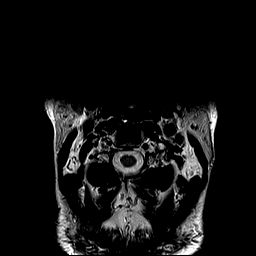
[im 27/27]
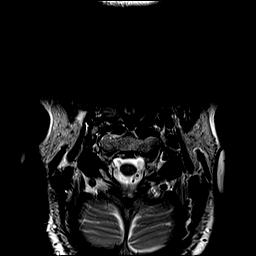

[Series 6: GRE · axial · 3.0mm · 0.35mm/px · z∈[-51,-25]mm · 3 of 27 slices shown]
[im 1/27]
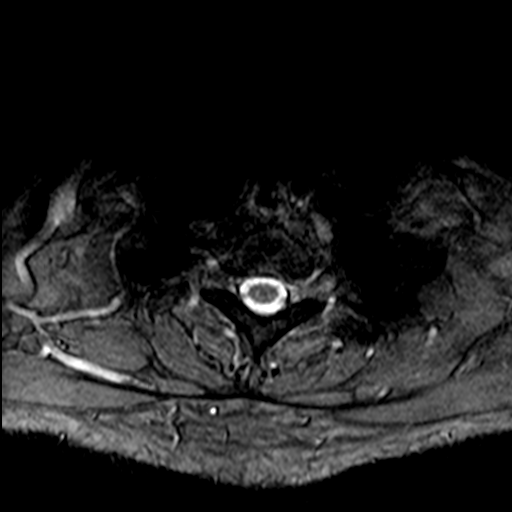
[im 5/27]
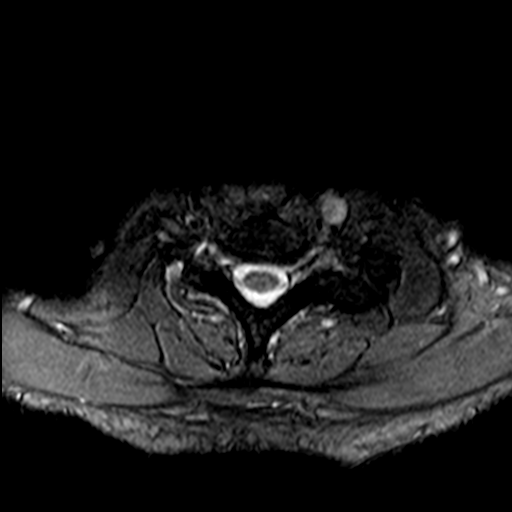
[im 8/27]
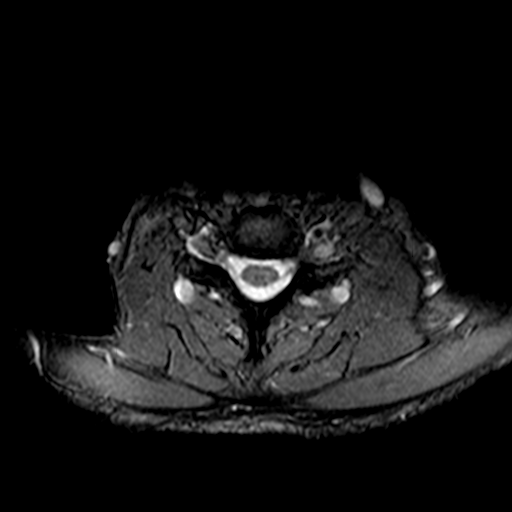

[36 of 48 positions shown; findings below may reference images not displayed]

FINDINGS: Alignment: Vertebral bodies normally aligned with preservation of
the normal cervical lordosis. No listhesis or static subluxation.

Vertebrae: Vertebral body height maintained without acute or chronic
fracture. Bone marrow signal intensity within normal limits. No
discrete or worrisome osseous lesions. No abnormal marrow edema.

Cord: Normal signal and morphology.

Posterior Fossa, vertebral arteries, paraspinal tissues: Generalized
age-related cerebral atrophy noted within the visualized brain.
Craniocervical junction within normal limits. Paraspinous and
prevertebral soft tissues are normal. Normal flow voids seen within
the vertebral arteries bilaterally.

Disc levels:

C1-2: Mild degenerative change noted about the C1-2 articulation
with mild thickening of the tectorial membrane. No significant
spinal stenosis. Overall, appearance is mildly progressed from
previous.

C2-C3: Unremarkable.

C3-C4:  Unremarkable.

C4-C5: Mild annular disc bulge. No significant spinal stenosis.
Foramina remain patent.

C5-C6: Mild annular disc bulge. No significant spinal stenosis.
Foramina remain patent.

C6-C7:  Unremarkable.

C7-T1: Normal interspace. Mild facet hypertrophy. No canal or
foraminal stenosis.

Visualized upper thoracic spine demonstrates no significant finding.
IMPRESSION: 1. Mild noncompressive disc bulging at C4-5 and C5-6 without
stenosis or neural impingement.
2. Mild degenerative change about the C1-2 articulation without
significant stenosis, mildly progressed from previous.

## 2020-12-20 DIAGNOSIS — J309 Allergic rhinitis, unspecified: Secondary | ICD-10-CM | POA: Diagnosis not present

## 2020-12-20 DIAGNOSIS — I10 Essential (primary) hypertension: Secondary | ICD-10-CM | POA: Diagnosis not present

## 2020-12-20 DIAGNOSIS — F419 Anxiety disorder, unspecified: Secondary | ICD-10-CM | POA: Diagnosis not present

## 2020-12-23 ENCOUNTER — Emergency Department (HOSPITAL_COMMUNITY): Payer: Medicare HMO

## 2020-12-23 ENCOUNTER — Encounter (HOSPITAL_COMMUNITY): Payer: Self-pay | Admitting: Emergency Medicine

## 2020-12-23 ENCOUNTER — Other Ambulatory Visit: Payer: Self-pay

## 2020-12-23 ENCOUNTER — Emergency Department (HOSPITAL_COMMUNITY)
Admission: EM | Admit: 2020-12-23 | Discharge: 2020-12-23 | Disposition: A | Discharge status: Home / Self Care | Payer: Medicare HMO | Attending: Emergency Medicine | Admitting: Emergency Medicine

## 2020-12-23 DIAGNOSIS — R0602 Shortness of breath: Secondary | ICD-10-CM | POA: Diagnosis not present

## 2020-12-23 DIAGNOSIS — R079 Chest pain, unspecified: Secondary | ICD-10-CM | POA: Diagnosis not present

## 2020-12-23 DIAGNOSIS — Z87891 Personal history of nicotine dependence: Secondary | ICD-10-CM | POA: Insufficient documentation

## 2020-12-23 DIAGNOSIS — R0989 Other specified symptoms and signs involving the circulatory and respiratory systems: Secondary | ICD-10-CM | POA: Diagnosis not present

## 2020-12-23 DIAGNOSIS — K449 Diaphragmatic hernia without obstruction or gangrene: Secondary | ICD-10-CM | POA: Diagnosis not present

## 2020-12-23 DIAGNOSIS — E119 Type 2 diabetes mellitus without complications: Secondary | ICD-10-CM | POA: Diagnosis not present

## 2020-12-23 DIAGNOSIS — I10 Essential (primary) hypertension: Secondary | ICD-10-CM | POA: Insufficient documentation

## 2020-12-23 DIAGNOSIS — Z79899 Other long term (current) drug therapy: Secondary | ICD-10-CM | POA: Insufficient documentation

## 2020-12-23 DIAGNOSIS — R0789 Other chest pain: Secondary | ICD-10-CM | POA: Insufficient documentation

## 2020-12-23 DIAGNOSIS — Z7984 Long term (current) use of oral hypoglycemic drugs: Secondary | ICD-10-CM | POA: Insufficient documentation

## 2020-12-23 LAB — CBC
HCT: 42.7 % (ref 39.0–52.0)
Hemoglobin: 15 g/dL (ref 13.0–17.0)
MCH: 30.7 pg (ref 26.0–34.0)
MCHC: 35.1 g/dL (ref 30.0–36.0)
MCV: 87.5 fL (ref 80.0–100.0)
Platelets: 197 10*3/uL (ref 150–400)
RBC: 4.88 MIL/uL (ref 4.22–5.81)
RDW: 12.5 % (ref 11.5–15.5)
WBC: 6.5 10*3/uL (ref 4.0–10.5)
nRBC: 0 % (ref 0.0–0.2)

## 2020-12-23 LAB — BASIC METABOLIC PANEL
Anion gap: 10 (ref 5–15)
BUN: <5 mg/dL — ABNORMAL LOW (ref 8–23)
CO2: 24 mmol/L (ref 22–32)
Calcium: 8.9 mg/dL (ref 8.9–10.3)
Chloride: 99 mmol/L (ref 98–111)
Creatinine, Ser: 0.7 mg/dL (ref 0.61–1.24)
GFR, Estimated: >60 mL/min (ref >60)
Glucose, Bld: 179 mg/dL — ABNORMAL HIGH (ref 70–99)
Potassium: 3.4 mmol/L — ABNORMAL LOW (ref 3.5–5.1)
Sodium: 133 mmol/L — ABNORMAL LOW (ref 135–145)

## 2020-12-23 LAB — RAPID URINE DRUG SCREEN, HOSP PERFORMED
Amphetamines: NOT DETECTED
Barbiturates: NOT DETECTED
Benzodiazepines: NOT DETECTED
Cocaine: NOT DETECTED
Opiates: NOT DETECTED
Tetrahydrocannabinol: NOT DETECTED

## 2020-12-23 LAB — CBG MONITORING, ED: Glucose-Capillary: 149 mg/dL — ABNORMAL HIGH (ref 70–99)

## 2020-12-23 LAB — TROPONIN I (HIGH SENSITIVITY)
Troponin I (High Sensitivity): 4 ng/L (ref <18)
Troponin I (High Sensitivity): 6 ng/L (ref <18)

## 2020-12-23 LAB — D-DIMER, QUANTITATIVE: D-Dimer, Quant: 0.68 ug/mL-FEU — ABNORMAL HIGH (ref 0.00–0.50)

## 2020-12-23 MED ORDER — ALBUTEROL SULFATE HFA 108 (90 BASE) MCG/ACT IN AERS
2.0000 | INHALATION_SPRAY | Freq: Once | RESPIRATORY_TRACT | Status: AC
  Administered 2020-12-23: 2 via RESPIRATORY_TRACT
  Filled 2020-12-23: qty 6.7

## 2020-12-23 MED ORDER — IOHEXOL 350 MG/ML SOLN
75.0000 mL | Freq: Once | INTRAVENOUS | Status: AC | PRN
  Administered 2020-12-23: 75 mL via INTRAVENOUS

## 2020-12-23 NOTE — ED Provider Notes (Signed)
[facility] EMERGENCY DEPARTMENT Provider Note   CSN: [id number] Arrival date & time: 12/23/20  1347     History Chief Complaint  Patient presents with  . Chest Pain  . Shortness of Breath    Mike Hart is a 65 y.o. male with PMHx HTN, HLD, Diabetes, who presents to the ED today with complaint of ongoing chest pain and shortness of breath x 3-4 weeks. Pt reports he was seen in the ED a couple of weeks ago for same and discharged with cardiology follow up. He had a CTA coronaries done with normal findings and is planning to have an ECHO sometime this month. Pt states he feels like his symptoms are getting worse prompting him to come to the ED today. He reports that everyday he will have a sudden onset of shortness of breath with associated chest pain. He states that his blood pressure has also been high lately despite taking all of his medications. Pt has taken Mucinex DM for the past two days including today however states that his blood pressure has been high prior to taking this medication. He reports that his chest feels "congested" and he thinks there is something in his lungs and he is requesting medication for same. He denies any recent sick contacts. No concern for COVID 10. Denies fevers, chills, cough, hemoptysis, diaphoresis, nausea, vomiting, leg swelling, or any other associated symptoms.  Per chart review: Seen in the ED on 02/09 for chest pain however appears his chest pain resolved prior to him being seen and he was mostly focused on his neuropathy. Work up negative at that time and EKG without acute ischemic changes. Blood pressure normotensive 136/83  Cardiology appt 2/24 with Dr. Mayford Knife. Per note it appears that pt stated he was not having any further episodes of chest pain but has had problems with SOB and his blood pressure running high. Blood pressure again normotensive 138/80  CTA Coronaries 03/04 IMPRESSION: 1. Coronary calcium score of 0.  This was 0 percentile for age and sex matched control. 2.  Normal coronary origin with left dominance. 3. No evidence of CAD. CAD RADS 0. There is motion artifact in the mid LAD but no obvious calcified plaque. 4.  Consider non atherosclerotic causes of chest pain.  ECHO scheduled for 03/21   The history is provided by the patient and medical records.       Past Medical History:  Diagnosis Date  . Aortic atherosclerosis (HCC)   . Chronic pain   . Diabetes mellitus without complication (HCC)   . Hyperlipidemia   . Hypertension     Patient Active Problem List   Diagnosis Date Noted  . Aortic atherosclerosis (HCC)   . HLD (hyperlipidemia) 12/07/2020  . Benign essential HTN 12/07/2020  . DM type 2, goal HbA1c < 7% (HCC) 12/07/2020  . Chronic pain   . Diabetes (HCC) 05/07/2013    History reviewed. No pertinent surgical history.     Family History  Problem Relation Age of Onset  . Lung cancer Mother     Social History   Tobacco Use  . Smoking status: Former Smoker    Quit date: 01/01/1995    Years since quitting: 25.9  . Smokeless tobacco: Never Used  Substance Use Topics  . Alcohol use: No  . Drug use: Not Currently    Home Medications Prior to Admission medications   Medication Sig Start Date End Date Taking? Authorizing Provider  atorvastatin (LIPITOR) 20 MG tablet Take  20 mg by mouth daily.    [provider]  cetirizine (ZYRTEC) 10 MG tablet Take 10 mg by mouth daily.    [provider]  fluticasone (FLONASE) 50 MCG/ACT nasal spray Place 1 spray into both nostrils daily. For seasonal allergies    [provider]  lisinopril-hydrochlorothiazide (PRINZIDE,ZESTORETIC) 20-25 MG per tablet Take 1 tablet by mouth daily.    [provider]  metFORMIN (GLUCOPHAGE) 1000 MG tablet Take 1 tablet (1000 mg) by mouth every morning and 1 and 1/2 tablets (1500 mg) every evening    [provider]  metoprolol tartrate (LOPRESSOR)  100 MG tablet Take 1 tablet (100 mg total) by mouth once for 1 dose. 12/07/20 12/07/20  Quintella Reichert, MD  Multiple Vitamins-Minerals (MULTIVITAMIN WITH MINERALS) tablet Take 1 tablet by mouth daily.    [provider]  pregabalin (LYRICA) 50 MG capsule Take 1 capsule by mouth as directed. TAKE ONE CAPSULE BY MOUTH EVERY NIGHT AT BEDTIME FOR 7 NIGHT THEN 2 CAPSULES EVERY NIGHT AT BEDTIME 11/01/20   [provider]    Allergies    Patient has no known allergies.  Review of Systems   Review of Systems  Constitutional: Negative for chills, fatigue and fever.  HENT: Positive for congestion.   Respiratory: Positive for shortness of breath. Negative for cough.   Cardiovascular: Positive for chest pain. Negative for palpitations and leg swelling.  Gastrointestinal: Negative for abdominal pain, nausea and vomiting.  All other systems reviewed and are negative.   Physical Exam Updated Vital Signs BP (!) 190/104   Pulse 92   Temp 97.8 F (36.6 C) (Oral)   Resp 14   SpO2 100%   Physical Exam Vitals and nursing note reviewed.  Constitutional:      Appearance: He is not ill-appearing or diaphoretic.  HENT:     Head: Normocephalic and atraumatic.  Eyes:     Conjunctiva/sclera: Conjunctivae normal.  Cardiovascular:     Rate and Rhythm: Normal rate and regular rhythm.     Pulses:          Radial pulses are 2+ on the right side and 2+ on the left side.       Dorsalis pedis pulses are 2+ on the right side and 2+ on the left side.     Heart sounds: Normal heart sounds.  Pulmonary:     Effort: Pulmonary effort is normal.     Breath sounds: Normal breath sounds. No decreased breath sounds, wheezing, rhonchi or rales.     Comments: Speaking in full sentences without difficulty. Satting 100% on RA. LCTAB Chest:     Chest wall: No tenderness.  Abdominal:     Palpations: Abdomen is soft.     Tenderness: There is no abdominal tenderness. There is no guarding or rebound.   Musculoskeletal:     Cervical back: Neck supple.     Right lower leg: No tenderness. No edema.     Left lower leg: No tenderness. No edema.  Skin:    General: Skin is warm and dry.  Neurological:     Mental Status: He is alert.     ED Results / Procedures / Treatments   Labs (all labs ordered are listed, but only abnormal results are displayed) Labs Reviewed  BASIC METABOLIC PANEL - Abnormal; Notable for the following components:      Result Value   Sodium 133 (*)    Potassium 3.4 (*)    Glucose, Bld 179 (*)  BUN <5 (*)    All other components within normal limits  D-DIMER, QUANTITATIVE - Abnormal; Notable for the following components:   D-Dimer, Quant 0.68 (*)    All other components within normal limits  CBG MONITORING, ED - Abnormal; Notable for the following components:   Glucose-Capillary 149 (*)    All other components within normal limits  CBC  RAPID URINE DRUG SCREEN, HOSP PERFORMED  TROPONIN I (HIGH SENSITIVITY)  TROPONIN I (HIGH SENSITIVITY)    EKG None  Radiology DG Chest 2 View  Result Date: 12/23/2020 CLINICAL DATA:  Chest pain and shortness of breath EXAM: CHEST - 2 VIEW COMPARISON:  November 21, 2020 FINDINGS: Lungs are clear. Heart size and pulmonary vascularity are normal. No adenopathy. No pneumothorax. No bone lesions. IMPRESSION: Lungs clear.  Cardiac silhouette normal. Electronically Signed   By: Bretta BangWilliam  Woodruff III M.D.   On: 12/23/2020 15:10   CT Angio Chest PE W/Cm &/Or Wo Cm  Result Date: 12/23/2020 CLINICAL DATA:  Left-sided chest pain and shortness of breath for 3-4 weeks EXAM: CT ANGIOGRAPHY CHEST WITH CONTRAST TECHNIQUE: Multidetector CT imaging of the chest was performed using the standard protocol during bolus administration of intravenous contrast. Multiplanar CT image reconstructions and MIPs were obtained to evaluate the vascular anatomy. CONTRAST:  75mL OMNIPAQUE IOHEXOL 350 MG/ML SOLN COMPARISON:  12/18/2020 FINDINGS:  Cardiovascular: This is a technically adequate evaluation of the pulmonary vasculature. No filling defects or pulmonary emboli. The heart is unremarkable without pericardial effusion. No evidence of thoracic aortic aneurysm or dissection. Mediastinum/Nodes: No enlarged mediastinal, hilar, or axillary lymph nodes. Thyroid gland, trachea, and esophagus demonstrate no significant findings. Stable small hiatal hernia. Lungs/Pleura: No acute airspace disease, effusion, or pneumothorax. Central airways are patent. Upper Abdomen: No acute abnormality. Musculoskeletal: No acute or destructive bony lesions. Reconstructed images demonstrate no additional findings. Review of the MIP images confirms the above findings. IMPRESSION: 1. No evidence of pulmonary embolus. 2. No acute intrathoracic process. Electronically Signed   By: Sharlet SalinaMichael  Brown M.D.   On: 12/23/2020 18:16    Procedures Procedures   Medications Ordered in ED Medications  albuterol (VENTOLIN HFA) 108 (90 Base) MCG/ACT inhaler 2 puff (2 puffs Inhalation Given 12/23/20 1615)  iohexol (OMNIPAQUE) 350 MG/ML injection 75 mL (75 mLs Intravenous Contrast Given 12/23/20 1757)    ED Course  I have reviewed the triage vital signs and the nursing notes.  Pertinent labs & imaging results that were available during my care of the patient were reviewed by me and considered in my medical decision making (see chart for details).    MDM Rules/Calculators/A&P                          65 year old male presents to the ED today with complaint of ongoing intermittent shortness of breath and chest pain for the past 3 to 4 weeks.  Seen in the ED on 2/09 with a negative work-up, followed up with cardiology who performed a CT coronary which was completely negative with plan for echo on 3/21.  Patient reports he continues to have symptoms prompting him to come back to the ED today.  He also complains of chest congestion.  Has been taking Zyrtec, Flonase, as well as  Mucinex DM for the past 2 days.  Reports his blood pressure has been intermittently high as well.  Blood pressure today 190/104.  Mender vitals unremarkable.  Patient afebrile, nontachycardic and nontachypneic.  Patient states he is  taking all of his blood pressure medications and denies missing any.  On exam he appears to be in no acute distress.  He is able to speak in full sentences without difficulty and is satting 100% on room air.  Lungs clear to auscultation bilaterally.  He has equal pulses.  He is not having any active chest pain, no chest wall tenderness palpation on exam.  Remainder physical exam unremarkable.  EKG on arrival today appears unchanged from previous on 2/09. CBC without leukocytosis and hgb stable at 15.0. BMP with sodium 133, potassium 3.4, glucose 179.  Bicarb WNL and no gap. Troponin of 4. CXR obtained and pending at time of examination however has returned normal as well.   Will plan to repeat blood pressure, obtain repeat troponin as well as d dimer. Symptoms do not sound consistent with PE however I am unable to [facility] out due to age. Will also add on UDS at this time to ensure there is no drug use including cocaine that could be causing pt's noncardiac chest pain. His symptoms happen throughout the day - he denies orthopnea/symptoms only at nighttime and he does not appear fluid overloaded today; low suspicion for CHF. He has equal pulses bilaterally without active chest pain; low suspicion for dissection. Pt denies any infectious symptoms to suggest pneumonia, covid, flu, TB.   Repeat troponin 6 D dimer elevated at 0.68; unable to age adjust. Will proceed with CTA to rule out PE.   CTA negative for acute findings. Blood pressure continues to be stable with most recent 156/74. Will discharge pt home at this time. He is requesting something to help "Clear" his lungs out. Again his LCTAB without any evidence of abnormality on CXR or CT scan however given continued shortness of  breath will plan to place referral to pulmnology. Pt advised to keep his appointment for ECHO on 03/21. He is in agreement with plan and stable for discharge.   This note was prepared using Dragon voice recognition software and may include unintentional dictation errors due to the inherent limitations of voice recognition software.  Final Clinical Impression(s) / ED Diagnoses Final diagnoses:  Shortness of breath    Rx / DC Orders ED Discharge Orders         Ordered    Ambulatory referral to Pulmonology       Comments: SOB and intermittent chest pain. Seen by cardiology with negative workup so far.   12/23/20 1933           Discharge Instructions     Your workup was very reassuring in the ED today. I have placed an ambulatory referral to the pulmonology office for further evaluation.   Use the inhaler in the meantime as needed for shortness of breath  Keep appointment for your ECHO as scheduled  Return to the ED for any worsening symptoms       Tanda Rockers, Cordelia Poche 12/23/20 1934    Tilden Fossa, MD 12/23/20 2038

## 2020-12-23 NOTE — Discharge Instructions (Signed)
Your workup was very reassuring in the ED today. I have placed an ambulatory referral to the pulmonology office for further evaluation.   Use the inhaler in the meantime as needed for shortness of breath  Keep appointment for your ECHO as scheduled  Return to the ED for any worsening symptoms

## 2020-12-23 NOTE — ED Triage Notes (Signed)
C/o L sided chest pain and SOB for 3-4 weeks.  States he was seen in ED and referred to cardiologist.  States he had a chest CT and is supposed to have an ECHO.

## 2020-12-25 ENCOUNTER — Ambulatory Visit: Payer: Medicare HMO | Admitting: Pulmonary Disease

## 2020-12-25 ENCOUNTER — Encounter: Payer: Self-pay | Admitting: Pulmonary Disease

## 2020-12-25 ENCOUNTER — Other Ambulatory Visit: Payer: Self-pay

## 2020-12-25 VITALS — BP 144/82 | HR 87 | Temp 98.1°F | Ht 71.0 in | Wt 164.0 lb

## 2020-12-25 DIAGNOSIS — T7840XA Allergy, unspecified, initial encounter: Secondary | ICD-10-CM

## 2020-12-25 DIAGNOSIS — J45909 Unspecified asthma, uncomplicated: Secondary | ICD-10-CM | POA: Diagnosis not present

## 2020-12-25 MED ORDER — PREDNISONE 20 MG PO TABS: ORAL_TABLET | ORAL | 0 refills | Status: DC

## 2020-12-25 MED ORDER — MONTELUKAST SODIUM 10 MG PO TABS: 10.0000 mg | ORAL_TABLET | Freq: Every day | ORAL | 6 refills | Status: DC

## 2020-12-25 MED ORDER — ALPRAZOLAM 0.5 MG PO TABS: 0.5000 mg | ORAL_TABLET | Freq: Two times a day (BID) | ORAL | 2 refills | Status: DC | PRN

## 2020-12-25 NOTE — Patient Instructions (Addendum)
Allergies Anxiety with ongoing symptoms  Your recent CT scan as we looked that was normal Your recent cardiac CT was normal  We will schedule you for breathing study  Continue using Zyrtec Continue using Flonase  We will add Singulair-for allergies  Prescription for Xanax for anxiety  Prednisone for inflammation to be used for about 7 days  Follow-up in 4 weeks

## 2020-12-25 NOTE — Progress Notes (Signed)
Mike Hart    [id number]    1956-05-26  Primary Care Physician:Koirala, Dibas, MD  Referring Physician: Tanda Rockers, PA-C [address] [place],  [place] [address]  Chief complaint:   Patient with a history of allergies Been evaluated for shortness of breath  HPI:  Recent ED visit for shortness of breath  He does have a chronic history of environmental allergies usually in spring and winter will have shortness of breath Uses Flonase and recently started using Zyrtec for his allergies Has been having more symptoms recently necessitated urgent care and ED visit  Has no underlying history of lung disease or heart disease  He does have a history of hypertension, diabetes, hypercholesterolemia  Reformed smoker quit over 25 years ago less than a pack a day smoker  Not limited during the summer months but during winter and spring will notice nasal stuffiness and congestion  Recently has been having some chest discomfort and tightness  He recently was prescribed albuterol   Outpatient Encounter Medications as of 12/25/2020  Medication Sig  . atorvastatin (LIPITOR) 20 MG tablet Take 20 mg by mouth daily.  . cetirizine (ZYRTEC) 10 MG tablet Take 10 mg by mouth daily.  . fluticasone (FLONASE) 50 MCG/ACT nasal spray Place 1 spray into both nostrils daily. For seasonal allergies  . lisinopril-hydrochlorothiazide (PRINZIDE,ZESTORETIC) 20-25 MG per tablet Take 1 tablet by mouth daily.  . metFORMIN (GLUCOPHAGE) 1000 MG tablet Take 1 tablet (1000 mg) by mouth every morning and 1 and 1/2 tablets (1500 mg) every evening  . Multiple Vitamins-Minerals (MULTIVITAMIN WITH MINERALS) tablet Take 1 tablet by mouth daily.  . pregabalin (LYRICA) 50 MG capsule Take 1 capsule by mouth as directed. TAKE ONE CAPSULE BY MOUTH EVERY NIGHT AT BEDTIME FOR 7 NIGHT THEN 2 CAPSULES EVERY NIGHT AT BEDTIME  . [DISCONTINUED] metoprolol tartrate (LOPRESSOR) 100 MG tablet Take 1 tablet (100 mg  total) by mouth once for 1 dose.   No facility-administered encounter medications on file as of 12/25/2020.    Allergies as of 12/25/2020  . (No Known Allergies)    Past Medical History:  Diagnosis Date  . Aortic atherosclerosis (HCC)   . Chronic pain   . Diabetes mellitus without complication (HCC)   . Hyperlipidemia   . Hypertension     No past surgical history on file.  Family History  Problem Relation Age of Onset  . Lung cancer Mother     Social History   Socioeconomic History  . Marital status: Single    Spouse name: Not on file  . Number of children: Not on file  . Years of education: Not on file  . Highest education level: Not on file  Occupational History  . Not on file  Tobacco Use  . Smoking status: Former Smoker    Quit date: 01/01/1995    Years since quitting: 26.0  . Smokeless tobacco: Never Used  Substance and Sexual Activity  . Alcohol use: No  . Drug use: Not Currently  . Sexual activity: Not on file  Other Topics Concern  . Not on file  Social History Narrative  . Not on file   Social Determinants of Health   Financial Resource Strain: Not on file  Food Insecurity: Not on file  Transportation Needs: Not on file  Physical Activity: Not on file  Stress: Not on file  Social Connections: Not on file  Intimate Partner Violence: Not on file    Review  of Systems  Constitutional: Negative for fatigue.  Respiratory: Positive for chest tightness and shortness of breath.   Psychiatric/Behavioral: The patient is nervous/anxious.     Vitals:   12/25/20 1325  BP: (!) 144/82  Pulse: 87  Temp: 98.1 F (36.7 C)  SpO2: 97%     Physical Exam Constitutional:      Appearance: He is obese.  HENT:     Nose: No congestion or rhinorrhea.     Mouth/Throat:     Mouth: Mucous membranes are moist.  Eyes:     General:        Right eye: No discharge.        Left eye: No discharge.  Cardiovascular:     Rate and Rhythm: Normal rate and regular  rhythm.     Heart sounds: No murmur heard. No friction rub.  Pulmonary:     Effort: Pulmonary effort is normal. No respiratory distress.     Breath sounds: No stridor. No wheezing or rhonchi.  Musculoskeletal:     Cervical back: No rigidity or tenderness.  Neurological:     Mental Status: He is alert.  Psychiatric:        Mood and Affect: Mood normal.    Data Reviewed: Recent chest CT reviewed with the patient showing no acute abnormality  Recent cardiac CT reviewed with the patient showing no significant abnormality  Assessment:  Allergies  Anxiety  Reformed smoker  Inhaler technique was reviewed today and teaching was performed  Plan/Recommendations: Schedule patient for PFT  Added Singulair to be used daily especially in the season when he has more problems  Prescription for Xanax 0.5 twice daily  I did prescribe out short course of steroids which patient elected not to use at present  Encouraged to only use albuterol if shortness of breath and wheezing  I will see him back in about 4 to 6 weeks  Encouraged to call with any significant concerns   Virl Diamond MD [facility] 12/25/2020, 1:44 PM  CC: Tanda Rockers, PA-C

## 2020-12-26 DIAGNOSIS — M5481 Occipital neuralgia: Secondary | ICD-10-CM | POA: Diagnosis not present

## 2020-12-26 DIAGNOSIS — M4716 Other spondylosis with myelopathy, lumbar region: Secondary | ICD-10-CM | POA: Diagnosis not present

## 2021-01-01 ENCOUNTER — Other Ambulatory Visit: Payer: Self-pay

## 2021-01-01 ENCOUNTER — Ambulatory Visit (HOSPITAL_COMMUNITY): Payer: Medicare HMO | Attending: Cardiovascular Disease

## 2021-01-01 DIAGNOSIS — E119 Type 2 diabetes mellitus without complications: Secondary | ICD-10-CM | POA: Diagnosis not present

## 2021-01-01 DIAGNOSIS — E7800 Pure hypercholesterolemia, unspecified: Secondary | ICD-10-CM | POA: Diagnosis not present

## 2021-01-01 DIAGNOSIS — I10 Essential (primary) hypertension: Secondary | ICD-10-CM | POA: Diagnosis not present

## 2021-01-01 DIAGNOSIS — R072 Precordial pain: Secondary | ICD-10-CM | POA: Insufficient documentation

## 2021-01-01 DIAGNOSIS — R0602 Shortness of breath: Secondary | ICD-10-CM | POA: Diagnosis not present

## 2021-01-01 DIAGNOSIS — R079 Chest pain, unspecified: Secondary | ICD-10-CM | POA: Insufficient documentation

## 2021-01-01 LAB — ECHOCARDIOGRAM COMPLETE
Area-P 1/2: 3.81 cm2
S' Lateral: 2.1 cm

## 2021-01-19 ENCOUNTER — Other Ambulatory Visit (HOSPITAL_COMMUNITY)
Admission: RE | Admit: 2021-01-19 | Discharge: 2021-01-19 | Disposition: A | Discharge status: Home / Self Care | Payer: Medicare HMO | Source: Ambulatory Visit | Attending: Pulmonary Disease | Admitting: Pulmonary Disease

## 2021-01-19 DIAGNOSIS — Z20822 Contact with and (suspected) exposure to covid-19: Secondary | ICD-10-CM | POA: Diagnosis not present

## 2021-01-19 DIAGNOSIS — Z01812 Encounter for preprocedural laboratory examination: Secondary | ICD-10-CM | POA: Diagnosis not present

## 2021-01-20 LAB — SARS CORONAVIRUS 2 (TAT 6-24 HRS): SARS Coronavirus 2: NEGATIVE

## 2021-01-22 ENCOUNTER — Ambulatory Visit: Payer: Medicare HMO | Admitting: Pulmonary Disease

## 2021-01-22 ENCOUNTER — Ambulatory Visit (INDEPENDENT_AMBULATORY_CARE_PROVIDER_SITE_OTHER): Payer: Medicare HMO | Admitting: Pulmonary Disease

## 2021-01-22 ENCOUNTER — Other Ambulatory Visit: Payer: Self-pay

## 2021-01-22 ENCOUNTER — Encounter: Payer: Self-pay | Admitting: Pulmonary Disease

## 2021-01-22 VITALS — BP 148/86 | HR 85 | Temp 97.5°F | Ht 70.0 in | Wt 173.0 lb

## 2021-01-22 DIAGNOSIS — R0602 Shortness of breath: Secondary | ICD-10-CM | POA: Diagnosis not present

## 2021-01-22 DIAGNOSIS — R0600 Dyspnea, unspecified: Secondary | ICD-10-CM | POA: Diagnosis not present

## 2021-01-22 DIAGNOSIS — F419 Anxiety disorder, unspecified: Secondary | ICD-10-CM | POA: Diagnosis not present

## 2021-01-22 DIAGNOSIS — T7840XA Allergy, unspecified, initial encounter: Secondary | ICD-10-CM

## 2021-01-22 LAB — PULMONARY FUNCTION TEST
DL/VA % pred: 99 %
DL/VA: 4.07 ml/min/mmHg/L
DLCO cor % pred: 88 %
DLCO cor: 23.33 ml/min/mmHg
DLCO unc % pred: 89 %
DLCO unc: 23.59 ml/min/mmHg
FEF 25-75 Post: 3.86 L/sec
FEF 25-75 Pre: 3.3 L/sec
FEF2575-%Change-Post: 17 %
FEF2575-%Pred-Post: 147 %
FEF2575-%Pred-Pre: 126 %
FEV1-%Change-Post: 4 %
FEV1-%Pred-Post: 115 %
FEV1-%Pred-Pre: 110 %
FEV1-Post: 3.42 L
FEV1-Pre: 3.27 L
FEV1FVC-%Change-Post: 3 %
FEV1FVC-%Pred-Pre: 103 %
FEV6-%Change-Post: 1 %
FEV6-%Pred-Post: 110 %
FEV6-%Pred-Pre: 109 %
FEV6-Post: 4.13 L
FEV6-Pre: 4.08 L
FEV6FVC-%Change-Post: 0 %
FEV6FVC-%Pred-Post: 104 %
FEV6FVC-%Pred-Pre: 104 %
FVC-%Change-Post: 1 %
FVC-%Pred-Post: 106 %
FVC-%Pred-Pre: 105 %
FVC-Post: 4.16 L
FVC-Pre: 4.09 L
Post FEV1/FVC ratio: 82 %
Post FEV6/FVC ratio: 100 %
Pre FEV1/FVC ratio: 80 %
Pre FEV6/FVC Ratio: 100 %
RV % pred: 74 %
RV: 1.78 L
TLC % pred: 86 %
TLC: 6.06 L

## 2021-01-22 MED ORDER — BREO ELLIPTA 100-25 MCG/INH IN AEPB: 1.0000 | INHALATION_SPRAY | Freq: Every day | RESPIRATORY_TRACT | 1 refills | Status: DC

## 2021-01-22 MED ORDER — DULOXETINE HCL 30 MG PO CPEP: 30.0000 mg | ORAL_CAPSULE | Freq: Every day | ORAL | 1 refills | Status: DC

## 2021-01-22 NOTE — Progress Notes (Signed)
Mike Hart    [id number]    07-02-1956  Primary Care Physician:Koirala, Dibas, MD  Referring Physician: Darrow Bussing, MD [address] [place],  [place] [address]  Chief complaint:   Patient with a history of allergies Been evaluated for shortness of breath  HPI:  Recent ED visit for shortness of breath  He was started on Singulair, Xanax for anxiety Symptoms remain about the same  Still with chest discomfort, shortness of breath with activity He is compliant with Zyrtec and Flonase for his allergies  He does have a chronic history of environmental allergies usually in spring and winter will have shortness of breath  Has not recently been to the ED but symptoms are still persistent  Has no underlying history of lung disease or heart disease  He does have a history of hypertension, diabetes, hypercholesterolemia  Reformed smoker quit over 25 years ago less than a pack a day smoker  Not limited during the summer months but during winter and spring will notice nasal stuffiness and congestion  Recently has been having some chest discomfort and tightness  He recently was prescribed albuterol-helps a little bit  Recently had PFT-within normal limits CT scan of the chest-within normal limits   Outpatient Encounter Medications as of 01/22/2021  Medication Sig  . ALPRAZolam (XANAX) 0.5 MG tablet Take 1 tablet (0.5 mg total) by mouth 2 (two) times daily as needed for anxiety.  Marland Kitchen atorvastatin (LIPITOR) 20 MG tablet Take 20 mg by mouth daily.  . cetirizine (ZYRTEC) 10 MG tablet Take 10 mg by mouth daily.  . fluticasone (FLONASE) 50 MCG/ACT nasal spray Place 1 spray into both nostrils daily. For seasonal allergies  . lisinopril-hydrochlorothiazide (PRINZIDE,ZESTORETIC) 20-25 MG per tablet Take 1 tablet by mouth daily.  . metFORMIN (GLUCOPHAGE) 1000 MG tablet Take 1 tablet (1000 mg) by mouth every morning and 1 and 1/2 tablets (1500 mg) every  evening  . montelukast (SINGULAIR) 10 MG tablet Take 1 tablet (10 mg total) by mouth at bedtime.  . Multiple Vitamins-Minerals (MULTIVITAMIN WITH MINERALS) tablet Take 1 tablet by mouth daily.  . predniSONE (DELTASONE) 20 MG tablet Daily for 7 days  . pregabalin (LYRICA) 50 MG capsule Take 1 capsule by mouth as directed. TAKE ONE CAPSULE BY MOUTH EVERY NIGHT AT BEDTIME FOR 7 NIGHT THEN 2 CAPSULES EVERY NIGHT AT BEDTIME   No facility-administered encounter medications on file as of 01/22/2021.    Allergies as of 01/22/2021  . (No Known Allergies)    Past Medical History:  Diagnosis Date  . Aortic atherosclerosis (HCC)   . Chronic pain   . Diabetes mellitus without complication (HCC)   . Hyperlipidemia   . Hypertension     No past surgical history on file.  Family History  Problem Relation Age of Onset  . Lung cancer Mother     Social History   Socioeconomic History  . Marital status: Single    Spouse name: Not on file  . Number of children: Not on file  . Years of education: Not on file  . Highest education level: Not on file  Occupational History  . Not on file  Tobacco Use  . Smoking status: Former Smoker    Quit date: 01/01/1995    Years since quitting: 26.0  . Smokeless tobacco: Never Used  Substance and Sexual Activity  . Alcohol use: No  . Drug use: Not Currently  . Sexual activity: Not on  file  Other Topics Concern  . Not on file  Social History Narrative  . Not on file   Social Determinants of Health   Financial Resource Strain: Not on file  Food Insecurity: Not on file  Transportation Needs: Not on file  Physical Activity: Not on file  Stress: Not on file  Social Connections: Not on file  Intimate Partner Violence: Not on file    Review of Systems  Constitutional: Negative for fatigue.  Respiratory: Positive for chest tightness and shortness of breath.   Psychiatric/Behavioral: The patient is nervous/anxious.     Vitals:   01/22/21 1141   BP: (!) 148/86  Pulse: 85  Temp: (!) 97.5 F (36.4 C)  SpO2: 98%     Physical Exam Constitutional:      Appearance: Normal appearance.  HENT:     Head: Normocephalic and atraumatic.     Nose: No congestion or rhinorrhea.     Mouth/Throat:     Mouth: Mucous membranes are moist.  Eyes:     General:        Right eye: No discharge.        Left eye: No discharge.  Cardiovascular:     Rate and Rhythm: Normal rate and regular rhythm.     Heart sounds: No murmur heard. No friction rub.  Pulmonary:     Effort: Pulmonary effort is normal. No respiratory distress.     Breath sounds: No stridor. No wheezing or rhonchi.  Musculoskeletal:     Cervical back: No rigidity or tenderness.  Neurological:     Mental Status: He is alert.  Psychiatric:        Mood and Affect: Mood normal.    Data Reviewed: Recent chest CT reviewed with the patient showing no acute abnormality  Pulmonary function test reviewed with the patient showing no obstruction, no restriction, normal diffusing capacity  Assessment:  Allergies -Continue using Flonase, Singulair  Anxiety -Xanax does not seem to be helping -We will switch to Cymbalta which may help his anxiety and also his chest discomfort  Reformed smoker  Inhaler technique was reviewed today and teaching was performed We will try Breo as he states albuterol does help some  Plan/Recommendations: Prescription for Cymbalta 30 mg daily Discontinue Xanax  Trial with Breo  Did not want to use oral steroids  I will see him back in 6 to 8 weeks  Encouraged to call with any significant concerns  I spent 30 minutes dedicated to the care of this patient on the date of this encounter to include previsit review of records, face-to-face time with the patient discussing conditions above, post visit ordering of testing, clinical documentation with electronic health record and communicated necessary findings to members of the patient's care  team  Virl Diamond MD [facility] 01/22/2021, 11:59 AM  CC: Darrow Bussing, MD

## 2021-01-22 NOTE — Progress Notes (Signed)
PFT done today. 

## 2021-01-22 NOTE — Patient Instructions (Signed)
We will try you on Cymbalta which will help with anxiety  Stop Xanax  Trial with [facility] for your shortness of breath from the cough   I will see you back in 6 weeks  Call with significant concerns

## 2021-01-25 ENCOUNTER — Telehealth: Payer: Self-pay | Admitting: Pulmonary Disease

## 2021-01-25 MED ORDER — ALBUTEROL SULFATE HFA 108 (90 BASE) MCG/ACT IN AERS: 2.0000 | INHALATION_SPRAY | Freq: Four times a day (QID) | RESPIRATORY_TRACT | 2 refills | Status: DC | PRN

## 2021-01-25 NOTE — Telephone Encounter (Signed)
Spoke with the pt  He states having elevated BP and hot flashes since started Breo 100 on 01/22/21  He says that he also has been feeling drowsy  All of the symptoms started after Virgel Bouquet was started  He is getting readings around 168/92 Still on lisinopril hct daily  He denies any vision changes or HA  Would like to go back to only using albuterol  Please advise, thanks!

## 2021-01-25 NOTE — Telephone Encounter (Signed)
Agree with just using Albuterol and stopping Breo

## 2021-01-25 NOTE — Telephone Encounter (Signed)
Spoke with the pt and notified of response per Tammy  He verbalized understanding  I will forward to Dr Wynona Neat to see if has further recs

## 2021-01-25 NOTE — Telephone Encounter (Signed)
Sorry to hear that he is not tolerating. May stop the [facility].  Use albuterol as needed.  Will send to Dr. Wynona Neat for any further instructions however please let the patient know that he not back into the office until next week.   Please contact office for sooner follow up if symptoms do not improve or worsen or seek emergency care

## 2021-01-29 NOTE — Telephone Encounter (Signed)
Called and spoke with patient. He stated that he has already stopped the [facility]. He is slowly starting to feel like himself. I advised him that if his breathing changes, to let our office know. He verbalized understanding.   Nothing further needed at time of call.

## 2021-01-30 ENCOUNTER — Telehealth: Payer: Self-pay | Admitting: Pulmonary Disease

## 2021-01-30 ENCOUNTER — Other Ambulatory Visit: Payer: Self-pay | Admitting: Pulmonary Disease

## 2021-01-30 ENCOUNTER — Other Ambulatory Visit (HOSPITAL_COMMUNITY): Payer: Self-pay

## 2021-01-30 MED ORDER — ALPRAZOLAM 0.5 MG PO TABS: 0.5000 mg | ORAL_TABLET | Freq: Two times a day (BID) | ORAL | 3 refills | Status: DC | PRN

## 2021-01-30 MED ORDER — ALPRAZOLAM 0.5 MG PO TABS
0.5000 mg | ORAL_TABLET | Freq: Two times a day (BID) | ORAL | 3 refills | Status: DC | PRN
  Filled 2021-01-30: qty 60, 30d supply, fill #0

## 2021-01-30 NOTE — Telephone Encounter (Signed)
AO.  The rx for the xanax was sent to [facility] pharmacy earlier.  I called and cancelled that refill.  I have pended the xanax to go to [facility] per the pts request.  Sorry for the confusion.  Thank you.

## 2021-01-30 NOTE — Telephone Encounter (Signed)
I have called the pt and he stated that the cymbalta makes him feel funny and he stated that he just cannot take this medication.  Pt is requesting that the xanax be called into the pharmacy for him to start back on.  AO please advise. Thanks   No Known Allergies

## 2021-01-30 NOTE — Telephone Encounter (Signed)
I have called the pt and he is aware of rx sent to the pharmacy for the xanax.

## 2021-01-30 NOTE — Telephone Encounter (Signed)
Prescription for xanax went to [facility], cymbalta discontinued

## 2021-02-02 NOTE — Telephone Encounter (Signed)
Checked pt's chart and saw that the Rx for pt's xanax was sent to [facility] 4/19. Attempted to call pt but unable to reach. Per pt's DPR, it is okay to leave detailed message on machine so left pt a detailed message letting him know that he should be able to pick up the Rx for Xanax at preferred pharmacy if he hadn't done so yet. Will close encounter.

## 2021-02-13 DIAGNOSIS — R0602 Shortness of breath: Secondary | ICD-10-CM | POA: Diagnosis not present

## 2021-02-13 DIAGNOSIS — J309 Allergic rhinitis, unspecified: Secondary | ICD-10-CM | POA: Diagnosis not present

## 2021-02-13 DIAGNOSIS — F419 Anxiety disorder, unspecified: Secondary | ICD-10-CM | POA: Diagnosis not present

## 2021-02-19 DIAGNOSIS — M4716 Other spondylosis with myelopathy, lumbar region: Secondary | ICD-10-CM | POA: Diagnosis not present

## 2021-02-19 DIAGNOSIS — M5481 Occipital neuralgia: Secondary | ICD-10-CM | POA: Diagnosis not present

## 2021-03-01 DIAGNOSIS — R0981 Nasal congestion: Secondary | ICD-10-CM | POA: Diagnosis not present

## 2021-03-01 DIAGNOSIS — I10 Essential (primary) hypertension: Secondary | ICD-10-CM | POA: Diagnosis not present

## 2021-03-05 DIAGNOSIS — Z79899 Other long term (current) drug therapy: Secondary | ICD-10-CM | POA: Diagnosis not present

## 2021-03-05 DIAGNOSIS — I10 Essential (primary) hypertension: Secondary | ICD-10-CM | POA: Diagnosis not present

## 2021-03-05 DIAGNOSIS — E1169 Type 2 diabetes mellitus with other specified complication: Secondary | ICD-10-CM | POA: Diagnosis not present

## 2021-03-05 DIAGNOSIS — E7800 Pure hypercholesterolemia, unspecified: Secondary | ICD-10-CM | POA: Diagnosis not present

## 2021-03-05 DIAGNOSIS — J309 Allergic rhinitis, unspecified: Secondary | ICD-10-CM | POA: Diagnosis not present

## 2021-03-06 ENCOUNTER — Encounter: Payer: Self-pay | Admitting: Pulmonary Disease

## 2021-03-06 ENCOUNTER — Ambulatory Visit: Payer: Medicare HMO | Admitting: Pulmonary Disease

## 2021-03-06 ENCOUNTER — Other Ambulatory Visit: Payer: Self-pay

## 2021-03-06 VITALS — BP 118/80 | HR 87 | Temp 97.0°F | Ht 71.0 in | Wt 166.8 lb

## 2021-03-06 DIAGNOSIS — F419 Anxiety disorder, unspecified: Secondary | ICD-10-CM | POA: Diagnosis not present

## 2021-03-06 DIAGNOSIS — R0602 Shortness of breath: Secondary | ICD-10-CM | POA: Diagnosis not present

## 2021-03-06 NOTE — Progress Notes (Signed)
Mike Hart    [id number]    02/20/1956  Primary Care Physician:Koirala, Dibas, MD  Referring Physician: Darrow Bussing, MD [address] [place],  [place] [address]  Chief complaint:   Patient with a history of allergies Evaluated for shortness of breath  HPI:  Recent ED visit for shortness of breath Has been doing well  Recently started on antibiotics by his primary care doctor  Did not tolerate Breo   Still with chest discomfort, shortness of breath with activity He is compliant with Zyrtec and Flonase for his allergies  He does have chronic environmental allergies, appears stable at present  No underlying lung disease or heart disease  He does have a history of hypertension, diabetes, hypercholesterolemia  Reformed smoker quit over 25 years ago less than a pack a day smoker  Not limited during the summer months but during winter and spring will notice nasal stuffiness and congestion  Recently has been having some chest discomfort and tightness  He recently was prescribed albuterol-helps a little bit  Recently had PFT-within normal limits CT scan of the chest-within normal limits   Outpatient Encounter Medications as of 03/06/2021  Medication Sig  . albuterol (VENTOLIN HFA) 108 (90 Base) MCG/ACT inhaler Inhale 2 puffs into the lungs every 6 (six) hours as needed for wheezing or shortness of breath.  . ALPRAZolam (XANAX) 0.5 MG tablet Take 1 tablet (0.5 mg total) by mouth 2 (two) times daily as needed for anxiety.  Marland Kitchen atorvastatin (LIPITOR) 20 MG tablet Take 20 mg by mouth daily.  . cetirizine (ZYRTEC) 10 MG tablet Take 10 mg by mouth daily.  . fluticasone (FLONASE) 50 MCG/ACT nasal spray Place 1 spray into both nostrils daily. For seasonal allergies  . fluticasone furoate-vilanterol (BREO ELLIPTA) 100-25 MCG/INH AEPB Inhale 1 puff into the lungs daily.  Marland Kitchen lisinopril-hydrochlorothiazide (PRINZIDE,ZESTORETIC) 20-25 MG per tablet  Take 1 tablet by mouth daily.  . metFORMIN (GLUCOPHAGE) 1000 MG tablet Take 1 tablet (1000 mg) by mouth every morning and 1 and 1/2 tablets (1500 mg) every evening  . montelukast (SINGULAIR) 10 MG tablet Take 1 tablet (10 mg total) by mouth at bedtime.  . Multiple Vitamins-Minerals (MULTIVITAMIN WITH MINERALS) tablet Take 1 tablet by mouth daily.  . pregabalin (LYRICA) 50 MG capsule Take 1 capsule by mouth as directed. TAKE ONE CAPSULE BY MOUTH EVERY NIGHT AT BEDTIME FOR 7 NIGHT THEN 2 CAPSULES EVERY NIGHT AT BEDTIME  . [DISCONTINUED] predniSONE (DELTASONE) 20 MG tablet Daily for 7 days   No facility-administered encounter medications on file as of 03/06/2021.    Allergies as of 03/06/2021  . (No Known Allergies)    Past Medical History:  Diagnosis Date  . Aortic atherosclerosis (HCC)   . Chronic pain   . Diabetes mellitus without complication (HCC)   . Hyperlipidemia   . Hypertension     History reviewed. No pertinent surgical history.  Family History  Problem Relation Age of Onset  . Lung cancer Mother     Social History   Socioeconomic History  . Marital status: Single    Spouse name: Not on file  . Number of children: Not on file  . Years of education: Not on file  . Highest education level: Not on file  Occupational History  . Not on file  Tobacco Use  . Smoking status: Former Smoker    Quit date: 01/01/1995    Years since quitting: 26.1  . Smokeless  tobacco: Never Used  Substance and Sexual Activity  . Alcohol use: No  . Drug use: Not Currently  . Sexual activity: Not on file  Other Topics Concern  . Not on file  Social History Narrative  . Not on file   Social Determinants of Health   Financial Resource Strain: Not on file  Food Insecurity: Not on file  Transportation Needs: Not on file  Physical Activity: Not on file  Stress: Not on file  Social Connections: Not on file  Intimate Partner Violence: Not on file    Review of Systems   Constitutional: Negative for fatigue.  Respiratory: Positive for chest tightness and shortness of breath.   Psychiatric/Behavioral: The patient is nervous/anxious.     Vitals:   03/06/21 1200  BP: 118/80  Pulse: 87  Temp: (!) 97 F (36.1 C)  SpO2: 98%     Physical Exam Constitutional:      Appearance: Normal appearance.  Cardiovascular:     Rate and Rhythm: Normal rate and regular rhythm.     Heart sounds: No murmur heard. No friction rub.  Pulmonary:     Effort: Pulmonary effort is normal. No respiratory distress.     Breath sounds: No stridor. No wheezing or rhonchi.  Musculoskeletal:     Cervical back: No rigidity or tenderness.  Neurological:     Mental Status: He is alert.  Psychiatric:        Mood and Affect: Mood normal.    Data Reviewed: Chest x-ray reviewed with no abnormality  Pulmonary function test reviewed with the patient showing no obstruction, no restriction, normal diffusing capacity  Assessment:  Allergies -Singulair Flonase  Anxiety -Using Xanax as needed plan -did not tolerate Cymbalta-off  Reformed smoker  Plan/Recommendations: Continue Xanax as needed  Symptoms are better controlled at present  I will see him as needed  Virl Diamond MD [facility] 03/06/2021, 12:13 PM  CC: Darrow Bussing, MD

## 2021-03-06 NOTE — Patient Instructions (Signed)
Glad you have been doing better  Have albuterol around to be used as needed for shortness of breath  Call with significant concerns  I will see you only as needed

## 2021-03-16 DIAGNOSIS — J301 Allergic rhinitis due to pollen: Secondary | ICD-10-CM | POA: Diagnosis not present

## 2021-03-16 DIAGNOSIS — R059 Cough, unspecified: Secondary | ICD-10-CM | POA: Diagnosis not present

## 2021-03-16 DIAGNOSIS — J3081 Allergic rhinitis due to animal (cat) (dog) hair and dander: Secondary | ICD-10-CM | POA: Diagnosis not present

## 2021-03-16 DIAGNOSIS — J3089 Other allergic rhinitis: Secondary | ICD-10-CM | POA: Diagnosis not present

## 2021-04-03 DIAGNOSIS — R69 Illness, unspecified: Secondary | ICD-10-CM | POA: Diagnosis not present

## 2021-04-18 ENCOUNTER — Telehealth: Payer: Self-pay | Admitting: Pulmonary Disease

## 2021-04-18 NOTE — Telephone Encounter (Signed)
LMTCB

## 2021-04-19 MED ORDER — PREDNISONE 10 MG PO TABS: 20.0000 mg | ORAL_TABLET | Freq: Every day | ORAL | 0 refills | Status: DC

## 2021-04-19 MED ORDER — FLUTICASONE FUROATE-VILANTEROL 200-25 MCG/INH IN AEPB: 1.0000 | INHALATION_SPRAY | Freq: Every day | RESPIRATORY_TRACT | 1 refills | Status: DC

## 2021-04-19 NOTE — Telephone Encounter (Signed)
Breathing study is 100% normal -This was done very recently  May be recovering from an infection  -The options we have will be to increase Breo from Breo 100 to Breo 200-still to be used once daily but has a little bit more steroids in it.   Anybody with some degree of airway hyperactivity will struggle more with weather changes or with high humidity. I have no other explanation   Any of the options below may be called in Short course of prednisone-20 mg daily for 5 days Prescription for Breo 200, to be used once daily

## 2021-04-19 NOTE — Telephone Encounter (Signed)
Patient is returning phone call. Patient phone number is [phone].

## 2021-04-19 NOTE — Telephone Encounter (Signed)
Primary Pulmonologist: Olalere Last office visit and with whom: Olalere 03/06/2021 What do we see them for (pulmonary problems): SOB Last OV assessment/plan:   Assessment:  Allergies -Singulair Flonase   Anxiety -Using Xanax as needed plan -did not tolerate Cymbalta-off   Reformed smoker   Plan/Recommendations: Continue Xanax as needed   Symptoms are better controlled at present   I will see him as needed   Virl Diamond MD  [facility] 03/06/2021, 12:13 PM   CC: Darrow Bussing, MD           Patient Instructions by Tomma Lightning, MD at 03/06/2021 12:00 PM  Author: Tomma Lightning, MD Author Type: Physician Filed: 03/06/2021 12:19 PM  Note Status: Signed Cosign: Cosign Not Required Encounter Date: 03/06/2021  Editor: Tomma Lightning, MD (Physician)               Glad you have been doing better   Have albuterol around to be used as needed for shortness of breath   Call with significant concerns   I will see you only as needed         Instructions  Glad you have been doing better   Have albuterol around to be used as needed for shortness of breath   Call with significant concerns   I will see you only as needed        Reason for call: Sob with small amount of activity.  Using Breo daily, but still having to use the albuterol 2-3 times a day.  Sob is getting increasing worse.  Worse if he is out in the heat.  He does get relief with the albuterol inhaler and it lasts up to 5 hours.  He has been on recent antibiotics from his dentist for his tooth and his pcp and he no longer is coughing up any mucous.  He wants to know if his lungs are good, why is he still having sob?  Dr. Wynona Neat, please advise.  Thank you.  (examples of things to ask: : When did symptoms start? Fever? Cough? Productive? Color to sputum? More sputum than usual? Wheezing? Have you needed increased oxygen? Are you taking your respiratory medications? What  over the counter measures have you tried?)  No Known Allergies  Immunization History  Administered Date(s) Administered   Influenza Split 10/09/2011, 08/23/2012, 07/13/2013, 06/28/2014, 07/14/2015, 07/03/2018, 06/28/2020   PFIZER(Purple Top)SARS-COV-2 Vaccination 12/16/2019, 01/12/2020

## 2021-04-19 NOTE — Telephone Encounter (Signed)
Called and spoke with patient, advised of recommendations per Dr. Wynona Neat.  Patient stated that he did not feel like the Breo 100 helped, but had only used it a couple of times and was not sure how to use it.  I advised him the best I could over the phone on how to use it.  I encouraged him to ask the pharmacy to show him how to use it properly because if it is not used properly he will not get the medication and it will not help with the sob.  I instructed him to use it everyday no matter how he feels and if after 5-6 days he sees no improvement he can get the Breo 200 from the pharmacy and use that in stead.  He was instructed to use one or the other, no both and to only use it once a day.  Instructed him also that he can use the Albuterol every 6 hours as needed.  I told him to call back if he was not feeling better after he completed the 5 days of the prednisone.  He verbalized understanding.  Nothing further needed.

## 2021-04-19 NOTE — Telephone Encounter (Signed)
Lmtcb for pt.  

## 2021-04-27 DIAGNOSIS — J324 Chronic pansinusitis: Secondary | ICD-10-CM | POA: Diagnosis not present

## 2021-04-27 DIAGNOSIS — J343 Hypertrophy of nasal turbinates: Secondary | ICD-10-CM | POA: Diagnosis not present

## 2021-04-27 DIAGNOSIS — J342 Deviated nasal septum: Secondary | ICD-10-CM | POA: Diagnosis not present

## 2021-04-27 DIAGNOSIS — J310 Chronic rhinitis: Secondary | ICD-10-CM | POA: Diagnosis not present

## 2021-05-03 ENCOUNTER — Other Ambulatory Visit: Payer: Self-pay | Admitting: Otolaryngology

## 2021-05-03 DIAGNOSIS — J329 Chronic sinusitis, unspecified: Secondary | ICD-10-CM

## 2021-05-07 ENCOUNTER — Telehealth: Payer: Self-pay

## 2021-05-07 DIAGNOSIS — Z006 Encounter for examination for normal comparison and control in clinical research program: Secondary | ICD-10-CM

## 2021-05-07 NOTE — Telephone Encounter (Signed)
I called patient for his 90-day Identify Study follow up phone call. Patient is doing well. Pt stated he is still having shortness of breath with activity. He is seeing a Pulmonologist for this symptom. I reminded patient I would call him in March for his 1 year follow-up.

## 2021-05-10 ENCOUNTER — Telehealth: Payer: Self-pay | Admitting: Pulmonary Disease

## 2021-05-10 NOTE — Telephone Encounter (Signed)
Called and spoke with pt and he stated that he stopped using the [facility] about 4-5 days ago due to having discomfort, shaking, and pain all over.  He stated that he is only using the albuterol HFA now and he has been using this TID, daily.  He stated that he is still not feeling any better, but is not feeling worse either.  He didn't know if he needed to try something else.  AO please advise. Thanks

## 2021-05-10 NOTE — Telephone Encounter (Signed)
I am not certain that its the Breo causing the discomfort shaking and pain all over  He can leave off Breo and continue just using albuterol as needed  Only other option is prednisone 20 mg p.o. daily for 5 days which patient did not want to use in the past  If agreeable, this can be called in

## 2021-05-10 NOTE — Telephone Encounter (Signed)
Called and spoke with patient. He verbalized understanding. He will D/C the [facility] and use the albuterol.   Nothing further needed at time of call.

## 2021-05-22 DIAGNOSIS — M4716 Other spondylosis with myelopathy, lumbar region: Secondary | ICD-10-CM | POA: Diagnosis not present

## 2021-05-22 DIAGNOSIS — M5481 Occipital neuralgia: Secondary | ICD-10-CM | POA: Diagnosis not present

## 2021-05-23 ENCOUNTER — Ambulatory Visit
Admission: RE | Admit: 2021-05-23 | Discharge: 2021-05-23 | Disposition: A | Discharge status: Home / Self Care | Payer: Medicare HMO | Source: Ambulatory Visit | Attending: Otolaryngology | Admitting: Otolaryngology

## 2021-05-23 ENCOUNTER — Other Ambulatory Visit: Payer: Self-pay

## 2021-05-23 DIAGNOSIS — J329 Chronic sinusitis, unspecified: Secondary | ICD-10-CM | POA: Diagnosis not present

## 2021-05-28 DIAGNOSIS — J342 Deviated nasal septum: Secondary | ICD-10-CM | POA: Diagnosis not present

## 2021-05-28 DIAGNOSIS — J343 Hypertrophy of nasal turbinates: Secondary | ICD-10-CM | POA: Diagnosis not present

## 2021-05-28 DIAGNOSIS — J310 Chronic rhinitis: Secondary | ICD-10-CM | POA: Diagnosis not present

## 2021-06-11 ENCOUNTER — Other Ambulatory Visit: Payer: Self-pay

## 2021-06-11 ENCOUNTER — Ambulatory Visit: Payer: Medicare HMO | Admitting: Pulmonary Disease

## 2021-06-11 ENCOUNTER — Encounter: Payer: Self-pay | Admitting: Pulmonary Disease

## 2021-06-11 VITALS — BP 122/68 | HR 76 | Temp 98.4°F | Ht 71.0 in | Wt 170.8 lb

## 2021-06-11 DIAGNOSIS — R0602 Shortness of breath: Secondary | ICD-10-CM | POA: Diagnosis not present

## 2021-06-11 MED ORDER — MONTELUKAST SODIUM 10 MG PO TABS: 10.0000 mg | ORAL_TABLET | Freq: Every day | ORAL | 6 refills | Status: DC

## 2021-06-11 MED ORDER — ALPRAZOLAM 0.5 MG PO TABS: 0.5000 mg | ORAL_TABLET | Freq: Two times a day (BID) | ORAL | 3 refills | Status: DC | PRN

## 2021-06-11 MED ORDER — ALBUTEROL SULFATE HFA 108 (90 BASE) MCG/ACT IN AERS: 2.0000 | INHALATION_SPRAY | Freq: Four times a day (QID) | RESPIRATORY_TRACT | 2 refills | Status: DC | PRN

## 2021-06-11 NOTE — Progress Notes (Signed)
Mike Hart    [id number]    05/31/1956  Primary Care Physician:Koirala, Dibas, MD  Referring Physician: Darrow Bussing, MD [address] [place],  [place] [address]  Chief complaint:   Shortness of breath, allergies  HPI:  Recent ED visit for shortness of breath Has been doing well  Uses Xanax, Singulair, albuterol Has been using albuterol up to 4 times a day  Inhaler technique was reviewed again today and was not optimal -This was discussed and taught  We did try Breo following his last visit but could not tolerate it  Still with chest discomfort, shortness of breath with activity He is compliant with Zyrtec and Flonase for his allergies  He does have chronic environmental allergies, appears stable at present  No underlying lung disease or heart disease  He does have a history of hypertension, diabetes, hypercholesterolemia  Reformed smoker quit over 25 years ago less than a pack a day smoker  Not limited during the summer months but during winter and spring will notice nasal stuffiness and congestion  Recently has been having some chest discomfort and tightness  He recently was prescribed albuterol-helps a little bit  Recently had PFT-within normal limits CT scan of the chest-within normal limits   Outpatient Encounter Medications as of 06/11/2021  Medication Sig   albuterol (VENTOLIN HFA) 108 (90 Base) MCG/ACT inhaler Inhale 2 puffs into the lungs every 6 (six) hours as needed for wheezing or shortness of breath.   ALPRAZolam (XANAX) 0.5 MG tablet Take 1 tablet (0.5 mg total) by mouth 2 (two) times daily as needed for anxiety.   amoxicillin (AMOXIL) 500 MG tablet    atorvastatin (LIPITOR) 20 MG tablet Take 20 mg by mouth daily.   cetirizine (ZYRTEC) 10 MG tablet Take 10 mg by mouth daily.   fluticasone (FLONASE) 50 MCG/ACT nasal spray Place 1 spray into both nostrils daily. For seasonal allergies   fluticasone  furoate-vilanterol (BREO ELLIPTA) 100-25 MCG/INH AEPB Inhale 1 puff into the lungs daily.   fluticasone furoate-vilanterol (BREO ELLIPTA) 200-25 MCG/INH AEPB Inhale 1 puff into the lungs daily.   lisinopril-hydrochlorothiazide (PRINZIDE,ZESTORETIC) 20-25 MG per tablet Take 1 tablet by mouth daily.   metFORMIN (GLUCOPHAGE) 1000 MG tablet Take 1 tablet (1000 mg) by mouth every morning and 1 and 1/2 tablets (1500 mg) every evening   montelukast (SINGULAIR) 10 MG tablet Take 1 tablet (10 mg total) by mouth at bedtime.   Multiple Vitamins-Minerals (MULTIVITAMIN WITH MINERALS) tablet Take 1 tablet by mouth daily.   predniSONE (DELTASONE) 10 MG tablet Take 2 tablets (20 mg total) by mouth daily with breakfast.   pregabalin (LYRICA) 50 MG capsule Take 1 capsule by mouth as directed. TAKE ONE CAPSULE BY MOUTH EVERY NIGHT AT BEDTIME FOR 7 NIGHT THEN 2 CAPSULES EVERY NIGHT AT BEDTIME   No facility-administered encounter medications on file as of 06/11/2021.    Allergies as of 06/11/2021   (No Known Allergies)    Past Medical History:  Diagnosis Date   Aortic atherosclerosis (HCC)    Chronic pain    Diabetes mellitus without complication (HCC)    Hyperlipidemia    Hypertension     History reviewed. No pertinent surgical history.  Family History  Problem Relation Age of Onset   Lung cancer Mother     Social History   Socioeconomic History   Marital status: Single    Spouse name: Not on file   Number of children:  Not on file   Years of education: Not on file   Highest education level: Not on file  Occupational History   Not on file  Tobacco Use   Smoking status: Former    Types: Cigarettes    Quit date: 01/01/1995    Years since quitting: 26.4   Smokeless tobacco: Never  Substance and Sexual Activity   Alcohol use: No   Drug use: Not Currently   Sexual activity: Not on file  Other Topics Concern   Not on file  Social History Narrative   Not on file   Social Determinants of  Health   Financial Resource Strain: Not on file  Food Insecurity: Not on file  Transportation Needs: Not on file  Physical Activity: Not on file  Stress: Not on file  Social Connections: Not on file  Intimate Partner Violence: Not on file    Review of Systems  Constitutional:  Negative for fatigue.  Respiratory:  Positive for chest tightness and shortness of breath.   Psychiatric/Behavioral:  The patient is nervous/anxious.    Vitals:   06/11/21 1152  BP: 122/68  Pulse: 76  Temp: 98.4 F (36.9 C)  SpO2: 100%     Physical Exam Constitutional:      Appearance: Normal appearance.  Cardiovascular:     Rate and Rhythm: Normal rate and regular rhythm.     Heart sounds: No murmur heard.   No friction rub.  Pulmonary:     Effort: Pulmonary effort is normal. No respiratory distress.     Breath sounds: No stridor. No wheezing or rhonchi.  Musculoskeletal:     Cervical back: No rigidity or tenderness.  Neurological:     Mental Status: He is alert.  Psychiatric:        Mood and Affect: Mood normal.   Data Reviewed: Chest x-ray reviewed with no abnormality  Pulmonary function test was within normal limits CT scan of the chest was within normal limits   Assessment:  Allergies -Singulair F-continue Singulair, Flonase lonase  Anxiety Continue using Xanax as needed -Did not tolerate Cymbalta  Reformed smoker  With significant shortness of breath and use of albuterol -Breo was prescribed but patient not able to tolerate it  Plan/Recommendations: Continue Xanax as needed  Singulair  Refill for Xanax, Singulair, albuterol sent in  Albuterol technique was reviewed with the patient  Virl Diamond MD [facility] 06/11/2021, 12:07 PM  CC: Darrow Bussing, MD

## 2021-06-11 NOTE — Patient Instructions (Addendum)
Continue Singulair, continue albuterol, continue xanax  Ensure you are using albuterol optimally  See you in 6 months

## 2021-07-16 ENCOUNTER — Telehealth: Payer: Self-pay | Admitting: Pulmonary Disease

## 2021-07-16 ENCOUNTER — Other Ambulatory Visit: Payer: Self-pay | Admitting: Pulmonary Disease

## 2021-07-16 NOTE — Telephone Encounter (Signed)
Called and spoke with Patient.  Patient requested a refill for albuterol HFA.   Patient stated albuterol is ordered every 6 hours as needed, but Patient is using albuterol every 4 hours scheduled to help with sob. Reviewed albuterol instructions and explained it is as needed for sob, wheezing.  Patient stated due to warm temps a few weeks ago, Patient used albuterol more often for sob, and Patient stated it helped. Patient request albuterol every 4 hours as needed prescription to be sent to [facility].  Message routed to Dr. Wynona Neat to advise prescription change.

## 2021-07-17 ENCOUNTER — Other Ambulatory Visit: Payer: Self-pay | Admitting: Pulmonary Disease

## 2021-07-17 MED ORDER — ALBUTEROL SULFATE HFA 108 (90 BASE) MCG/ACT IN AERS: 2.0000 | INHALATION_SPRAY | RESPIRATORY_TRACT | 5 refills | Status: DC | PRN

## 2021-07-17 NOTE — Progress Notes (Signed)
Albuterol refill sent to pharmacy.

## 2021-07-17 NOTE — Telephone Encounter (Signed)
Albuterol sent in to pharmacy. 

## 2021-07-22 ENCOUNTER — Other Ambulatory Visit: Payer: Self-pay | Admitting: Pulmonary Disease

## 2021-07-26 ENCOUNTER — Other Ambulatory Visit: Payer: Self-pay | Admitting: Pulmonary Disease

## 2021-08-08 DIAGNOSIS — R0600 Dyspnea, unspecified: Secondary | ICD-10-CM | POA: Diagnosis not present

## 2021-08-08 DIAGNOSIS — J310 Chronic rhinitis: Secondary | ICD-10-CM | POA: Diagnosis not present

## 2021-08-08 DIAGNOSIS — J343 Hypertrophy of nasal turbinates: Secondary | ICD-10-CM | POA: Diagnosis not present

## 2021-08-08 DIAGNOSIS — J342 Deviated nasal septum: Secondary | ICD-10-CM | POA: Diagnosis not present

## 2021-08-08 DIAGNOSIS — R070 Pain in throat: Secondary | ICD-10-CM | POA: Diagnosis not present

## 2021-08-10 DIAGNOSIS — R0602 Shortness of breath: Secondary | ICD-10-CM | POA: Diagnosis not present

## 2021-08-10 DIAGNOSIS — F411 Generalized anxiety disorder: Secondary | ICD-10-CM | POA: Diagnosis not present

## 2021-08-21 DIAGNOSIS — M4716 Other spondylosis with myelopathy, lumbar region: Secondary | ICD-10-CM | POA: Diagnosis not present

## 2021-08-21 DIAGNOSIS — M5481 Occipital neuralgia: Secondary | ICD-10-CM | POA: Diagnosis not present

## 2021-09-13 DIAGNOSIS — Z0001 Encounter for general adult medical examination with abnormal findings: Secondary | ICD-10-CM | POA: Diagnosis not present

## 2021-09-13 DIAGNOSIS — I10 Essential (primary) hypertension: Secondary | ICD-10-CM | POA: Diagnosis not present

## 2021-09-13 DIAGNOSIS — I700 Atherosclerosis of aorta: Secondary | ICD-10-CM | POA: Diagnosis not present

## 2021-09-13 DIAGNOSIS — E1169 Type 2 diabetes mellitus with other specified complication: Secondary | ICD-10-CM | POA: Diagnosis not present

## 2021-09-13 DIAGNOSIS — F411 Generalized anxiety disorder: Secondary | ICD-10-CM | POA: Diagnosis not present

## 2021-09-13 DIAGNOSIS — Z125 Encounter for screening for malignant neoplasm of prostate: Secondary | ICD-10-CM | POA: Diagnosis not present

## 2021-09-13 DIAGNOSIS — Z79899 Other long term (current) drug therapy: Secondary | ICD-10-CM | POA: Diagnosis not present

## 2021-09-13 DIAGNOSIS — E7800 Pure hypercholesterolemia, unspecified: Secondary | ICD-10-CM | POA: Diagnosis not present

## 2021-09-26 DIAGNOSIS — Z1211 Encounter for screening for malignant neoplasm of colon: Secondary | ICD-10-CM | POA: Diagnosis not present

## 2021-11-21 DIAGNOSIS — M5481 Occipital neuralgia: Secondary | ICD-10-CM | POA: Diagnosis not present

## 2021-11-21 DIAGNOSIS — M4716 Other spondylosis with myelopathy, lumbar region: Secondary | ICD-10-CM | POA: Diagnosis not present

## 2021-12-04 DIAGNOSIS — E7800 Pure hypercholesterolemia, unspecified: Secondary | ICD-10-CM | POA: Diagnosis not present

## 2021-12-04 DIAGNOSIS — E1169 Type 2 diabetes mellitus with other specified complication: Secondary | ICD-10-CM | POA: Diagnosis not present

## 2021-12-04 DIAGNOSIS — E1165 Type 2 diabetes mellitus with hyperglycemia: Secondary | ICD-10-CM | POA: Diagnosis not present

## 2021-12-04 DIAGNOSIS — R0981 Nasal congestion: Secondary | ICD-10-CM | POA: Diagnosis not present

## 2021-12-04 DIAGNOSIS — E119 Type 2 diabetes mellitus without complications: Secondary | ICD-10-CM | POA: Diagnosis not present

## 2021-12-04 DIAGNOSIS — F419 Anxiety disorder, unspecified: Secondary | ICD-10-CM | POA: Diagnosis not present

## 2021-12-04 DIAGNOSIS — I10 Essential (primary) hypertension: Secondary | ICD-10-CM | POA: Diagnosis not present

## 2021-12-04 DIAGNOSIS — G894 Chronic pain syndrome: Secondary | ICD-10-CM | POA: Diagnosis not present

## 2021-12-18 ENCOUNTER — Other Ambulatory Visit: Payer: Self-pay | Admitting: Pulmonary Disease

## 2021-12-18 NOTE — Telephone Encounter (Signed)
approved

## 2022-01-03 ENCOUNTER — Other Ambulatory Visit: Payer: Self-pay | Admitting: Pulmonary Disease

## 2022-01-03 NOTE — Telephone Encounter (Signed)
Please advise if you are okay refilling med. ? ? ?No Known Allergies ? ? ?Current Outpatient Medications:  ?  albuterol (VENTOLIN HFA) 108 (90 Base) MCG/ACT inhaler, INHALE 2 PUFFS INTO THE LUNGS EVERY 4 HOURS AS NEEDED FOR WHEEZING OR SHORTNESS OF BREATH, Disp: 6.7 g, Rfl: 5 ?  ALPRAZolam (XANAX) 0.5 MG tablet, Take 1 tablet (0.5 mg total) by mouth 2 (two) times daily as needed for anxiety., Disp: 90 tablet, Rfl: 3 ?  atorvastatin (LIPITOR) 20 MG tablet, Take 20 mg by mouth daily., Disp: , Rfl:  ?  cetirizine (ZYRTEC) 10 MG tablet, Take 10 mg by mouth daily., Disp: , Rfl:  ?  fluticasone (FLONASE) 50 MCG/ACT nasal spray, Place 1 spray into both nostrils daily. For seasonal allergies, Disp: , Rfl:  ?  lisinopril-hydrochlorothiazide (PRINZIDE,ZESTORETIC) 20-25 MG per tablet, Take 1 tablet by mouth daily., Disp: , Rfl:  ?  metFORMIN (GLUCOPHAGE) 1000 MG tablet, Take 1 tablet (1000 mg) by mouth every morning and 1 and 1/2 tablets (1500 mg) every evening, Disp: , Rfl:  ?  montelukast (SINGULAIR) 10 MG tablet, TAKE 1 TABLET(10 MG) BY MOUTH AT BEDTIME, Disp: 30 tablet, Rfl: 6 ?  Multiple Vitamins-Minerals (MULTIVITAMIN WITH MINERALS) tablet, Take 1 tablet by mouth daily., Disp: , Rfl:  ?  predniSONE (DELTASONE) 10 MG tablet, Take 2 tablets (20 mg total) by mouth daily with breakfast., Disp: 10 tablet, Rfl: 0 ?  pregabalin (LYRICA) 50 MG capsule, Take 1 capsule by mouth as directed. TAKE ONE CAPSULE BY MOUTH EVERY NIGHT AT BEDTIME FOR 7 NIGHT THEN 2 CAPSULES EVERY NIGHT AT BEDTIME, Disp: , Rfl:  ? ?

## 2022-01-03 NOTE — Telephone Encounter (Signed)
Xanax refilled.  

## 2022-02-26 DIAGNOSIS — M4716 Other spondylosis with myelopathy, lumbar region: Secondary | ICD-10-CM | POA: Diagnosis not present

## 2022-02-26 DIAGNOSIS — M5481 Occipital neuralgia: Secondary | ICD-10-CM | POA: Diagnosis not present

## 2022-03-05 DIAGNOSIS — J3489 Other specified disorders of nose and nasal sinuses: Secondary | ICD-10-CM | POA: Diagnosis not present

## 2022-03-05 DIAGNOSIS — Z87891 Personal history of nicotine dependence: Secondary | ICD-10-CM | POA: Diagnosis not present

## 2022-03-05 DIAGNOSIS — R0981 Nasal congestion: Secondary | ICD-10-CM | POA: Diagnosis not present

## 2022-03-08 ENCOUNTER — Other Ambulatory Visit: Payer: Self-pay | Admitting: Pulmonary Disease

## 2022-03-15 DIAGNOSIS — F411 Generalized anxiety disorder: Secondary | ICD-10-CM | POA: Diagnosis not present

## 2022-03-15 DIAGNOSIS — Z79899 Other long term (current) drug therapy: Secondary | ICD-10-CM | POA: Diagnosis not present

## 2022-03-15 DIAGNOSIS — I10 Essential (primary) hypertension: Secondary | ICD-10-CM | POA: Diagnosis not present

## 2022-03-15 DIAGNOSIS — G894 Chronic pain syndrome: Secondary | ICD-10-CM | POA: Diagnosis not present

## 2022-03-15 DIAGNOSIS — I700 Atherosclerosis of aorta: Secondary | ICD-10-CM | POA: Diagnosis not present

## 2022-03-15 DIAGNOSIS — E1169 Type 2 diabetes mellitus with other specified complication: Secondary | ICD-10-CM | POA: Diagnosis not present

## 2022-03-15 DIAGNOSIS — E7800 Pure hypercholesterolemia, unspecified: Secondary | ICD-10-CM | POA: Diagnosis not present

## 2022-03-18 ENCOUNTER — Encounter: Payer: Self-pay | Admitting: Pulmonary Disease

## 2022-03-18 ENCOUNTER — Ambulatory Visit: Payer: Medicare HMO | Admitting: Pulmonary Disease

## 2022-03-18 VITALS — BP 120/68 | HR 74 | Temp 98.0°F | Ht 71.0 in | Wt 175.0 lb

## 2022-03-18 DIAGNOSIS — R0602 Shortness of breath: Secondary | ICD-10-CM

## 2022-03-18 MED ORDER — ALPRAZOLAM 0.5 MG PO TABS: 0.5000 mg | ORAL_TABLET | Freq: Two times a day (BID) | ORAL | 1 refills | Status: DC | PRN

## 2022-03-18 MED ORDER — ALBUTEROL SULFATE HFA 108 (90 BASE) MCG/ACT IN AERS
2.0000 | INHALATION_SPRAY | RESPIRATORY_TRACT | 5 refills | Status: DC | PRN
Start: 2022-03-18 — End: 2022-10-25

## 2022-03-18 NOTE — Patient Instructions (Signed)
I will see you back in about 6 months  Call with significant concerns  Refills for your medication including Xanax and albuterol will be sent to pharmacy   Call with significant concerns

## 2022-03-18 NOTE — Progress Notes (Signed)
Mike Hart    [id number]    [birth year]  Primary Care Physician:Koirala, Dibas, MD  Referring Physician: Darrow Bussing, MD [address] [place],  [place] [address]  Chief complaint:   Shortness of breath, allergies  HPI:  Recent ED visit for shortness of breath Has been doing well  Uses Xanax, Singulair, albuterol Frequency of albuterol use as improved  Only uses albuterol about once a day  Continues to use Xanax about once a day as well  Is on an antihypertensive at present  She does describe some chest tightness  Has been trying to exercise on a regular basis about every other day of running  Breo was tried and it did not tolerate it    Still with chest discomfort, shortness of breath with activity He is compliant with Zyrtec and Flonase for his allergies  He does have chronic environmental allergies, appears stable at present  No underlying lung disease or heart disease  He does have a history of hypertension, diabetes, hypercholesterolemia  Reformed smoker quit over 25 years ago less than a pack a day smoker  Not limited during the summer months but during winter and spring will notice nasal stuffiness and congestion  Recently had PFT-within normal limits CT scan of the chest-within normal limits   Outpatient Encounter Medications as of 03/18/2022  Medication Sig   albuterol (VENTOLIN HFA) 108 (90 Base) MCG/ACT inhaler INHALE 2 PUFFS INTO THE LUNGS EVERY 4 HOURS AS NEEDED FOR WHEEZING OR SHORTNESS OF BREATH   ALPRAZolam (XANAX) 0.5 MG tablet TAKE 1 TABLET(0.5 MG) BY MOUTH TWICE DAILY AS NEEDED FOR ANXIETY   atorvastatin (LIPITOR) 20 MG tablet Take 20 mg by mouth daily.   cetirizine (ZYRTEC) 10 MG tablet Take 10 mg by mouth daily.   fluticasone (FLONASE) 50 MCG/ACT nasal spray Place 1 spray into both nostrils daily. For seasonal allergies   lisinopril-hydrochlorothiazide (PRINZIDE,ZESTORETIC) 20-25 MG per tablet Take 1  tablet by mouth daily.   metFORMIN (GLUCOPHAGE) 1000 MG tablet Take 1 tablet (1000 mg) by mouth every morning and 1 and 1/2 tablets (1500 mg) every evening   montelukast (SINGULAIR) 10 MG tablet TAKE 1 TABLET(10 MG) BY MOUTH AT BEDTIME   Multiple Vitamins-Minerals (MULTIVITAMIN WITH MINERALS) tablet Take 1 tablet by mouth daily.   [DISCONTINUED] pregabalin (LYRICA) 50 MG capsule Take 1 capsule by mouth as directed. TAKE ONE CAPSULE BY MOUTH EVERY NIGHT AT BEDTIME FOR 7 NIGHT THEN 2 CAPSULES EVERY NIGHT AT BEDTIME   amLODipine (NORVASC) 5 MG tablet Take 5 mg by mouth daily.   escitalopram (LEXAPRO) 10 MG tablet Take 10 mg by mouth daily.   methocarbamol (ROBAXIN) 500 MG tablet Take 500 mg by mouth daily.   pregabalin (LYRICA) 100 MG capsule Take 100 mg by mouth 3 (three) times daily.   [DISCONTINUED] predniSONE (DELTASONE) 10 MG tablet Take 2 tablets (20 mg total) by mouth daily with breakfast. (Patient not taking: Reported on 03/18/2022)   No facility-administered encounter medications on file as of 03/18/2022.    Allergies as of 03/18/2022   (No Known Allergies)    Past Medical History:  Diagnosis Date   Aortic atherosclerosis (HCC)    Chronic pain    Diabetes mellitus without complication (HCC)    Hyperlipidemia    Hypertension     No past surgical history on file.  Family History  Problem Relation Age of Onset   Lung cancer Mother  Social History   Socioeconomic History   Marital status: Single    Spouse name: Not on file   Number of children: Not on file   Years of education: Not on file   Highest education level: Not on file  Occupational History   Not on file  Tobacco Use   Smoking status: Former    Types: Cigarettes    Quit date: 01/01/1995    Years since quitting: 27.2   Smokeless tobacco: Never  Substance and Sexual Activity   Alcohol use: No   Drug use: Not Currently   Sexual activity: Not on file  Other Topics Concern   Not on file  Social History  Narrative   Not on file   Social Determinants of Health   Financial Resource Strain: Not on file  Food Insecurity: Not on file  Transportation Needs: Not on file  Physical Activity: Not on file  Stress: Not on file  Social Connections: Not on file  Intimate Partner Violence: Not on file    Review of Systems  Constitutional:  Negative for fatigue.  Respiratory:  Positive for chest tightness and shortness of breath.   Psychiatric/Behavioral:  The patient is not nervous/anxious.    Vitals:   03/18/22 1544  BP: 120/68  Pulse: 74  Temp: 98 F (36.7 C)  SpO2: 99%     Physical Exam Constitutional:      Appearance: Normal appearance.  HENT:     Mouth/Throat:     Mouth: Mucous membranes are moist.  Cardiovascular:     Rate and Rhythm: Normal rate and regular rhythm.     Heart sounds: No murmur heard.   No friction rub.  Pulmonary:     Effort: Pulmonary effort is normal. No respiratory distress.     Breath sounds: No stridor. No wheezing or rhonchi.  Musculoskeletal:     Cervical back: No rigidity or tenderness.  Neurological:     Mental Status: He is alert.  Psychiatric:        Mood and Affect: Mood normal.   Data Reviewed: Chest x-ray reviewed with no abnormality  Pulmonary function test was within normal limits CT scan of the chest was within normal limits   Assessment:  Allergies -Singulair F-continue Singulair, Flonase lonase  Anxiety Continue using Xanax as needed -Did not tolerate Cymbalta  Reformed smoker  Continue albuterol as needed  Plan/Recommendations: Continue Xanax as needed  Albuterol as needed  Symptoms/exposure control as able  Follow-up in 6 months   Virl Diamond MD [facility] 03/18/2022, 4:10 PM  CC: Darrow Bussing, MD

## 2022-04-13 ENCOUNTER — Other Ambulatory Visit: Payer: Self-pay | Admitting: Pulmonary Disease

## 2022-04-15 NOTE — Telephone Encounter (Signed)
Please advise on refill? Last OV was 03/18/22.

## 2022-04-17 ENCOUNTER — Telehealth: Payer: Self-pay | Admitting: Pulmonary Disease

## 2022-04-17 NOTE — Telephone Encounter (Signed)
I left a brief message per DPR that the albuterol and Xanax have been sent to the pharmacy for him.  He was asked to call back with any questions. Nothing further needed.

## 2022-04-18 DIAGNOSIS — E876 Hypokalemia: Secondary | ICD-10-CM | POA: Diagnosis not present

## 2022-04-30 ENCOUNTER — Other Ambulatory Visit: Payer: Self-pay

## 2022-06-09 ENCOUNTER — Other Ambulatory Visit: Payer: Self-pay | Admitting: Pulmonary Disease

## 2022-06-12 ENCOUNTER — Telehealth: Payer: Self-pay | Admitting: Pulmonary Disease

## 2022-06-13 NOTE — Telephone Encounter (Signed)
Dr Wynona Neat, please sign rx refill for alprazolam thanks

## 2022-06-24 DIAGNOSIS — R140 Abdominal distension (gaseous): Secondary | ICD-10-CM | POA: Diagnosis not present

## 2022-07-22 NOTE — Telephone Encounter (Signed)
Refill was sent on 08/30. Will close encounter.

## 2022-07-24 DIAGNOSIS — J309 Allergic rhinitis, unspecified: Secondary | ICD-10-CM | POA: Diagnosis not present

## 2022-07-24 DIAGNOSIS — R140 Abdominal distension (gaseous): Secondary | ICD-10-CM | POA: Diagnosis not present

## 2022-07-24 DIAGNOSIS — Z1211 Encounter for screening for malignant neoplasm of colon: Secondary | ICD-10-CM | POA: Diagnosis not present

## 2022-07-25 ENCOUNTER — Other Ambulatory Visit: Payer: Self-pay | Admitting: Family Medicine

## 2022-07-25 DIAGNOSIS — R140 Abdominal distension (gaseous): Secondary | ICD-10-CM

## 2022-07-30 ENCOUNTER — Other Ambulatory Visit: Payer: Self-pay | Admitting: Pulmonary Disease

## 2022-07-31 ENCOUNTER — Ambulatory Visit
Admission: RE | Admit: 2022-07-31 | Discharge: 2022-07-31 | Disposition: A | Discharge status: Home / Self Care | Payer: Medicare HMO | Source: Ambulatory Visit | Attending: Family Medicine | Admitting: Family Medicine

## 2022-07-31 DIAGNOSIS — R140 Abdominal distension (gaseous): Secondary | ICD-10-CM

## 2022-07-31 DIAGNOSIS — Q8909 Congenital malformations of spleen: Secondary | ICD-10-CM | POA: Diagnosis not present

## 2022-08-13 DIAGNOSIS — Z79891 Long term (current) use of opiate analgesic: Secondary | ICD-10-CM | POA: Diagnosis not present

## 2022-08-13 DIAGNOSIS — G894 Chronic pain syndrome: Secondary | ICD-10-CM | POA: Diagnosis not present

## 2022-08-13 DIAGNOSIS — M5481 Occipital neuralgia: Secondary | ICD-10-CM | POA: Diagnosis not present

## 2022-08-13 DIAGNOSIS — M4716 Other spondylosis with myelopathy, lumbar region: Secondary | ICD-10-CM | POA: Diagnosis not present

## 2022-08-13 DIAGNOSIS — Z79899 Other long term (current) drug therapy: Secondary | ICD-10-CM | POA: Diagnosis not present

## 2022-09-16 DIAGNOSIS — Z79899 Other long term (current) drug therapy: Secondary | ICD-10-CM | POA: Diagnosis not present

## 2022-09-16 DIAGNOSIS — I700 Atherosclerosis of aorta: Secondary | ICD-10-CM | POA: Diagnosis not present

## 2022-09-16 DIAGNOSIS — I10 Essential (primary) hypertension: Secondary | ICD-10-CM | POA: Diagnosis not present

## 2022-09-16 DIAGNOSIS — Z0001 Encounter for general adult medical examination with abnormal findings: Secondary | ICD-10-CM | POA: Diagnosis not present

## 2022-09-16 DIAGNOSIS — Z1159 Encounter for screening for other viral diseases: Secondary | ICD-10-CM | POA: Diagnosis not present

## 2022-09-16 DIAGNOSIS — Z1211 Encounter for screening for malignant neoplasm of colon: Secondary | ICD-10-CM | POA: Diagnosis not present

## 2022-09-16 DIAGNOSIS — E7800 Pure hypercholesterolemia, unspecified: Secondary | ICD-10-CM | POA: Diagnosis not present

## 2022-09-16 DIAGNOSIS — E1169 Type 2 diabetes mellitus with other specified complication: Secondary | ICD-10-CM | POA: Diagnosis not present

## 2022-09-16 DIAGNOSIS — Z125 Encounter for screening for malignant neoplasm of prostate: Secondary | ICD-10-CM | POA: Diagnosis not present

## 2022-09-20 DIAGNOSIS — H5203 Hypermetropia, bilateral: Secondary | ICD-10-CM | POA: Diagnosis not present

## 2022-09-24 DIAGNOSIS — K5900 Constipation, unspecified: Secondary | ICD-10-CM | POA: Diagnosis not present

## 2022-09-24 DIAGNOSIS — R140 Abdominal distension (gaseous): Secondary | ICD-10-CM | POA: Diagnosis not present

## 2022-09-24 DIAGNOSIS — R1013 Epigastric pain: Secondary | ICD-10-CM | POA: Diagnosis not present

## 2022-09-24 DIAGNOSIS — K219 Gastro-esophageal reflux disease without esophagitis: Secondary | ICD-10-CM | POA: Diagnosis not present

## 2022-10-01 DIAGNOSIS — K449 Diaphragmatic hernia without obstruction or gangrene: Secondary | ICD-10-CM | POA: Diagnosis not present

## 2022-10-01 DIAGNOSIS — B9681 Helicobacter pylori [H. pylori] as the cause of diseases classified elsewhere: Secondary | ICD-10-CM | POA: Diagnosis not present

## 2022-10-01 DIAGNOSIS — K219 Gastro-esophageal reflux disease without esophagitis: Secondary | ICD-10-CM | POA: Diagnosis not present

## 2022-10-01 DIAGNOSIS — K2930 Chronic superficial gastritis without bleeding: Secondary | ICD-10-CM | POA: Diagnosis not present

## 2022-10-01 DIAGNOSIS — R1013 Epigastric pain: Secondary | ICD-10-CM | POA: Diagnosis not present

## 2022-10-01 DIAGNOSIS — K2970 Gastritis, unspecified, without bleeding: Secondary | ICD-10-CM | POA: Diagnosis not present

## 2022-10-01 DIAGNOSIS — Q396 Congenital diverticulum of esophagus: Secondary | ICD-10-CM | POA: Diagnosis not present

## 2022-10-10 DIAGNOSIS — Z79899 Other long term (current) drug therapy: Secondary | ICD-10-CM | POA: Diagnosis not present

## 2022-10-11 DIAGNOSIS — K2930 Chronic superficial gastritis without bleeding: Secondary | ICD-10-CM | POA: Diagnosis not present

## 2022-10-11 DIAGNOSIS — B9681 Helicobacter pylori [H. pylori] as the cause of diseases classified elsewhere: Secondary | ICD-10-CM | POA: Diagnosis not present

## 2022-10-25 ENCOUNTER — Encounter: Payer: Self-pay | Admitting: Pulmonary Disease

## 2022-10-25 ENCOUNTER — Ambulatory Visit: Payer: Medicare HMO | Admitting: Pulmonary Disease

## 2022-10-25 VITALS — BP 122/70 | HR 72 | Ht 71.0 in | Wt 170.0 lb

## 2022-10-25 DIAGNOSIS — F419 Anxiety disorder, unspecified: Secondary | ICD-10-CM | POA: Diagnosis not present

## 2022-10-25 DIAGNOSIS — R0602 Shortness of breath: Secondary | ICD-10-CM

## 2022-10-25 MED ORDER — ALPRAZOLAM 0.5 MG PO TABS: ORAL_TABLET | ORAL | 2 refills | Status: DC

## 2022-10-25 MED ORDER — ALBUTEROL SULFATE HFA 108 (90 BASE) MCG/ACT IN AERS: 2.0000 | INHALATION_SPRAY | RESPIRATORY_TRACT | 5 refills | Status: DC | PRN

## 2022-10-25 NOTE — Patient Instructions (Signed)
I will see you back in about 6 months  Continue albuterol as needed  Continue Xanax as needed  Call with significant concerns

## 2022-10-25 NOTE — Progress Notes (Signed)
Mike Hart    [id number]    06-Apr-1956  Primary Care Physician:Koirala, Dibas, MD  Referring Physician: Lujean Amel, [address],  [place] [address]  Chief complaint:   Shortness of breath, allergies  HPI:  Has been doing relatively well  Recently had EGD showing H. pylori for which she was treated  Has some bloating for which he is on [place] to use albuterol and Xanax Feels relatively well over role  Albuterol use as needed  Still does have some chest tightness  Did not tolerate Breo previously  Does use over-the-counter medications for allergies and nasal stuffiness  He does have chronic environmental allergies, appears stable at present  No underlying lung disease or heart disease  He does have a history of hypertension, diabetes, hypercholesterolemia  Reformed smoker quit over 25 years ago less than a pack a day smoker  Not limited during the summer months but during winter and spring will notice nasal stuffiness and congestion  Recently had PFT-within normal limits CT scan of the chest-within normal limits   Outpatient Encounter Medications as of 10/25/2022  Medication Sig   albuterol (VENTOLIN HFA) 108 (90 Base) MCG/ACT inhaler Inhale 2 puffs into the lungs every 4 (four) hours as needed for wheezing or shortness of breath.   ALPRAZolam (XANAX) 0.5 MG tablet TAKE 1 TABLET(0.5 MG) BY MOUTH TWICE DAILY AS NEEDED FOR ANXIETY   amLODipine (NORVASC) 5 MG tablet Take 5 mg by mouth daily.   atorvastatin (LIPITOR) 20 MG tablet Take 20 mg by mouth daily.   baclofen (LIORESAL) 10 MG tablet Take 10 mg by mouth daily.   cetirizine (ZYRTEC) 10 MG tablet Take 10 mg by mouth daily.   doxycycline (MONODOX) 100 MG capsule Take 100 mg by mouth 2 (two) times daily.   escitalopram (LEXAPRO) 10 MG tablet Take 10 mg by mouth daily.   fluticasone (FLONASE) 50 MCG/ACT nasal spray Place 1 spray into both nostrils  daily. For seasonal allergies   FLUZONE HIGH-DOSE QUADRIVALENT 0.7 ML SUSY    lisinopril-hydrochlorothiazide (PRINZIDE,ZESTORETIC) 20-25 MG per tablet Take 1 tablet by mouth daily.   metFORMIN (GLUCOPHAGE) 1000 MG tablet Take 1 tablet (1000 mg) by mouth every morning and 1 and 1/2 tablets (1500 mg) every evening   methocarbamol (ROBAXIN) 500 MG tablet Take 500 mg by mouth daily.   metroNIDAZOLE (FLAGYL) 250 MG tablet Take 250 mg by mouth 4 (four) times daily.   montelukast (SINGULAIR) 10 MG tablet TAKE 1 TABLET(10 MG) BY MOUTH AT BEDTIME   pantoprazole (PROTONIX) 40 MG tablet Take 40 mg by mouth daily.   pregabalin (LYRICA) 100 MG capsule Take 100 mg by mouth 3 (three) times daily.   Multiple Vitamins-Minerals (MULTIVITAMIN WITH MINERALS) tablet Take 1 tablet by mouth daily. (Patient not taking: Reported on 10/25/2022)   No facility-administered encounter medications on file as of 10/25/2022.    Allergies as of 10/25/2022   (No Known Allergies)    Past Medical History:  Diagnosis Date   Aortic atherosclerosis (HCC)    Chronic pain    Diabetes mellitus without complication ([place])    Hyperlipidemia    Hypertension     No past surgical history on file.  Family History  Problem Relation Age of Onset   Lung cancer Mother     Social History   Socioeconomic History   Marital status: Single    Spouse name: Not on file  Number of children: Not on file   Years of education: Not on file   Highest education level: Not on file  Occupational History   Not on file  Tobacco Use   Smoking status: Former    Types: Cigarettes    Quit date: 01/01/1995    Years since quitting: 27.8   Smokeless tobacco: Never  Substance and Sexual Activity   Alcohol use: No   Drug use: Not Currently   Sexual activity: Not on file  Other Topics Concern   Not on file  Social History Narrative   Not on file   Social Determinants of Health   Financial Resource Strain: Not on file  Food Insecurity:  Not on file  Transportation Needs: Not on file  Physical Activity: Not on file  Stress: Not on file  Social Connections: Not on file  Intimate Partner Violence: Not on file    Review of Systems  Constitutional:  Negative for fatigue.  Respiratory:  Positive for chest tightness and shortness of breath.   Psychiatric/Behavioral:  The patient is not nervous/anxious.     Vitals:   10/25/22 1421  BP: 122/70  Pulse: 72  SpO2: 99%     Physical Exam Constitutional:      Appearance: Normal appearance.  HENT:     Mouth/Throat:     Mouth: Mucous membranes are moist.  Eyes:     General: No scleral icterus. Cardiovascular:     Rate and Rhythm: Normal rate and regular rhythm.     Heart sounds: No murmur heard.    No friction rub.  Pulmonary:     Effort: Pulmonary effort is normal. No respiratory distress.     Breath sounds: No stridor. No wheezing or rhonchi.  Musculoskeletal:     Cervical back: No rigidity or tenderness.  Neurological:     Mental Status: He is alert.  Psychiatric:        Mood and Affect: Mood normal.    Data Reviewed: Chest x-ray reviewed with no abnormality  Pulmonary function test was within normal limits  CT scan of the chest was within normal limits   Assessment:  Multiple allergies Continue Singulair, Flonase  Anxiety -Continue to use Xanax as needed -Was not able to tolerate Cymbalta  Continue albuterol as needed for shortness of breath  Is a reformed smoker  Plan/Recommendations: Continue Xanax as needed  Albuterol as needed  Symptom control as able  Follow-up in 6 months  Sherrilyn Rist MD  [facility] 10/25/2022, 2:49 PM  CC: Lujean Amel, MD

## 2022-11-22 ENCOUNTER — Other Ambulatory Visit: Payer: Self-pay | Admitting: Pulmonary Disease

## 2022-12-16 DIAGNOSIS — E1169 Type 2 diabetes mellitus with other specified complication: Secondary | ICD-10-CM | POA: Diagnosis not present

## 2023-01-23 ENCOUNTER — Other Ambulatory Visit: Payer: Self-pay | Admitting: Pulmonary Disease

## 2023-03-17 DIAGNOSIS — E1136 Type 2 diabetes mellitus with diabetic cataract: Secondary | ICD-10-CM | POA: Diagnosis not present

## 2023-03-17 DIAGNOSIS — I700 Atherosclerosis of aorta: Secondary | ICD-10-CM | POA: Diagnosis not present

## 2023-03-17 DIAGNOSIS — E1165 Type 2 diabetes mellitus with hyperglycemia: Secondary | ICD-10-CM | POA: Diagnosis not present

## 2023-03-17 DIAGNOSIS — Z79899 Other long term (current) drug therapy: Secondary | ICD-10-CM | POA: Diagnosis not present

## 2023-04-03 DIAGNOSIS — E876 Hypokalemia: Secondary | ICD-10-CM | POA: Diagnosis not present

## 2023-04-10 DIAGNOSIS — R194 Change in bowel habit: Secondary | ICD-10-CM | POA: Diagnosis not present

## 2023-04-10 DIAGNOSIS — E876 Hypokalemia: Secondary | ICD-10-CM | POA: Diagnosis not present

## 2023-05-02 DIAGNOSIS — Z1211 Encounter for screening for malignant neoplasm of colon: Secondary | ICD-10-CM | POA: Diagnosis not present

## 2023-05-02 DIAGNOSIS — R194 Change in bowel habit: Secondary | ICD-10-CM | POA: Diagnosis not present

## 2023-05-02 DIAGNOSIS — E876 Hypokalemia: Secondary | ICD-10-CM | POA: Diagnosis not present

## 2023-05-02 DIAGNOSIS — M79671 Pain in right foot: Secondary | ICD-10-CM | POA: Diagnosis not present

## 2023-05-06 DIAGNOSIS — H25043 Posterior subcapsular polar age-related cataract, bilateral: Secondary | ICD-10-CM | POA: Diagnosis not present

## 2023-05-06 DIAGNOSIS — H2513 Age-related nuclear cataract, bilateral: Secondary | ICD-10-CM | POA: Diagnosis not present

## 2023-05-06 DIAGNOSIS — H25013 Cortical age-related cataract, bilateral: Secondary | ICD-10-CM | POA: Diagnosis not present

## 2023-05-06 DIAGNOSIS — H18413 Arcus senilis, bilateral: Secondary | ICD-10-CM | POA: Diagnosis not present

## 2023-05-06 DIAGNOSIS — H2512 Age-related nuclear cataract, left eye: Secondary | ICD-10-CM | POA: Diagnosis not present

## 2023-05-08 ENCOUNTER — Ambulatory Visit (INDEPENDENT_AMBULATORY_CARE_PROVIDER_SITE_OTHER): Payer: Medicare HMO | Admitting: Podiatry

## 2023-05-08 ENCOUNTER — Other Ambulatory Visit: Payer: Self-pay | Admitting: Podiatry

## 2023-05-08 ENCOUNTER — Ambulatory Visit (INDEPENDENT_AMBULATORY_CARE_PROVIDER_SITE_OTHER): Payer: Medicare HMO

## 2023-05-08 DIAGNOSIS — M79671 Pain in right foot: Secondary | ICD-10-CM

## 2023-05-08 DIAGNOSIS — M722 Plantar fascial fibromatosis: Secondary | ICD-10-CM

## 2023-05-08 MED ORDER — IBUPROFEN 800 MG PO TABS: 800.0000 mg | ORAL_TABLET | Freq: Three times a day (TID) | ORAL | 0 refills | Status: DC | PRN

## 2023-05-08 NOTE — Patient Instructions (Signed)

## 2023-05-09 ENCOUNTER — Other Ambulatory Visit: Payer: Self-pay | Admitting: Podiatry

## 2023-05-09 DIAGNOSIS — M79671 Pain in right foot: Secondary | ICD-10-CM

## 2023-05-11 NOTE — Progress Notes (Signed)
  Subjective:  Patient ID: Mike Hart, male    DOB: [birth year],  MRN: [id number]  Chief Complaint  Patient presents with   Foot Pain    Pt states he has been having right heel pain for about 3-4 weeks now and hurts really bad when he is walking. He has been using ice packs and a lidocaine pain reliving topical he says the pain feels like stabbing and nerves?     67 y.o. male presents with the above complaint. History confirmed with patient.   Objective:  Physical Exam: warm, good capillary refill, no trophic changes or ulcerative lesions, normal DP and PT pulses, and normal sensory exam. Left Foot: normal exam, no swelling, tenderness, instability; ligaments intact, full range of motion of all ankle/foot joints Right Foot: point tenderness over the heel pad  No images are attached to the encounter.  Radiographs: Multiple views x-ray of the right foot: no fracture, dislocation, swelling or degenerative changes noted, plantar calcaneal spur, and posterior calcaneal spur Assessment:   1. Pain of right heel   2. Plantar fasciitis of right foot      Plan:  Patient was evaluated and treated and all questions answered.  Discussed the etiology and treatment options for plantar fasciitis including stretching, formal physical therapy, supportive shoegears such as a running shoe or sneaker, pre fabricated orthoses, injection therapy, and oral medications. We also discussed the role of surgical treatment of this for patients who do not improve after exhausting non-surgical treatment options.   -XR reviewed with patient -Educated patient on stretching and icing of the affected limb -Injection delivered to the plantar fascia of the right foot. - rx for ibuprofen 800mg  PRN TID  After sterile prep with povidone-iodine solution and alcohol, the right heel was injected with 0.5cc 2% xylocaine plain, 0.5cc 0.5% marcaine plain, 5mg  triamcinolone acetonide, and 2mg  dexamethasone was  injected along the medial plantar fascia at the insertion on the plantar calcaneus. The patient tolerated the procedure well without complication.  Return if symptoms worsen or fail to improve.

## 2023-05-28 ENCOUNTER — Other Ambulatory Visit: Payer: Self-pay | Admitting: Pulmonary Disease

## 2023-05-29 ENCOUNTER — Telehealth: Payer: Self-pay | Admitting: Pulmonary Disease

## 2023-05-29 NOTE — Telephone Encounter (Signed)
Answering service states patient needs refill for Xanax. Pharmacy is [facility] Patient phone number is [phone].

## 2023-05-30 NOTE — Telephone Encounter (Signed)
Patient is calling wanting a refill on his medication Xanax .

## 2023-06-02 NOTE — Telephone Encounter (Signed)
ATC X1 LVM advising patient xanax prescription was sent to pharmacy

## 2023-06-03 DIAGNOSIS — M4716 Other spondylosis with myelopathy, lumbar region: Secondary | ICD-10-CM | POA: Diagnosis not present

## 2023-06-25 DIAGNOSIS — H2512 Age-related nuclear cataract, left eye: Secondary | ICD-10-CM | POA: Diagnosis not present

## 2023-06-26 DIAGNOSIS — H2511 Age-related nuclear cataract, right eye: Secondary | ICD-10-CM | POA: Diagnosis not present

## 2023-07-01 DIAGNOSIS — Z961 Presence of intraocular lens: Secondary | ICD-10-CM | POA: Diagnosis not present

## 2023-07-01 DIAGNOSIS — R69 Illness, unspecified: Secondary | ICD-10-CM | POA: Diagnosis not present

## 2023-07-08 ENCOUNTER — Encounter: Payer: Self-pay | Admitting: Pulmonary Disease

## 2023-07-08 ENCOUNTER — Ambulatory Visit: Payer: Medicare HMO | Admitting: Pulmonary Disease

## 2023-07-08 VITALS — BP 130/70 | HR 58 | Temp 97.3°F | Ht 71.0 in | Wt 160.4 lb

## 2023-07-08 DIAGNOSIS — R0602 Shortness of breath: Secondary | ICD-10-CM

## 2023-07-08 DIAGNOSIS — F419 Anxiety disorder, unspecified: Secondary | ICD-10-CM

## 2023-07-08 MED ORDER — ALBUTEROL SULFATE HFA 108 (90 BASE) MCG/ACT IN AERS: 2.0000 | INHALATION_SPRAY | RESPIRATORY_TRACT | 5 refills | Status: DC | PRN

## 2023-07-08 MED ORDER — ALPRAZOLAM 0.5 MG PO TABS: ORAL_TABLET | ORAL | 3 refills | Status: DC

## 2023-07-08 NOTE — Progress Notes (Signed)
Mike Hart    [id number]    Mar 10, 1956  Primary Care Physician:Koirala, Dibas, MD  Referring Physician: Darrow Bussing, MD [address] [place],  [place] [address]  Chief complaint:   Shortness of breath, allergies  HPI:  Continues to do relatively okay  Noted to have EGD with H. pylori for which he was treated with course of antibiotics Still has some bloating for which he is on Linzess  Uses Xanax frequently for anxiety Albuterol use only as needed a few times a week  Still does have some chest tightness  Did not tolerate Breo previously  Does use over-the-counter medications for allergies and nasal stuffiness  He does have chronic environmental allergies, appears stable at present  No underlying lung disease or heart disease  He does have a history of hypertension, diabetes, hypercholesterolemia  Reformed smoker quit over 25 years ago less than a pack a day smoker  Not limited during the summer months but during winter and spring will notice nasal stuffiness and congestion  Recently had PFT-within normal limits CT scan of the chest-within normal limits   Outpatient Encounter Medications as of 07/08/2023  Medication Sig   albuterol (VENTOLIN HFA) 108 (90 Base) MCG/ACT inhaler Inhale 2 puffs into the lungs every 4 (four) hours as needed for wheezing or shortness of breath.   ALPRAZolam (XANAX) 0.5 MG tablet TAKE 1 TABLET(0.5 MG) BY MOUTH TWICE DAILY AS NEEDED FOR ANXIETY   amLODipine (NORVASC) 5 MG tablet Take 5 mg by mouth daily.   atorvastatin (LIPITOR) 20 MG tablet Take 20 mg by mouth daily.   baclofen (LIORESAL) 10 MG tablet Take 10 mg by mouth daily.   cetirizine (ZYRTEC) 10 MG tablet Take 10 mg by mouth daily.   escitalopram (LEXAPRO) 10 MG tablet Take 10 mg by mouth daily.   fluticasone (FLONASE) 50 MCG/ACT nasal spray Place 1 spray into both nostrils daily. For seasonal allergies   ibuprofen (ADVIL) 800 MG tablet Take  1 tablet (800 mg total) by mouth every 8 (eight) hours as needed.   lisinopril-hydrochlorothiazide (PRINZIDE,ZESTORETIC) 20-25 MG per tablet Take 1 tablet by mouth daily.   metFORMIN (GLUCOPHAGE) 1000 MG tablet Take 1 tablet (1000 mg) by mouth every morning and 1 and 1/2 tablets (1500 mg) every evening   methocarbamol (ROBAXIN) 500 MG tablet Take 500 mg by mouth daily.   pantoprazole (PROTONIX) 40 MG tablet Take 40 mg by mouth daily.   pregabalin (LYRICA) 100 MG capsule Take 100 mg by mouth 3 (three) times daily.   FLUZONE HIGH-DOSE QUADRIVALENT 0.7 ML SUSY    [DISCONTINUED] doxycycline (MONODOX) 100 MG capsule Take 100 mg by mouth 2 (two) times daily. (Patient not taking: Reported on 07/08/2023)   [DISCONTINUED] metroNIDAZOLE (FLAGYL) 250 MG tablet Take 250 mg by mouth 4 (four) times daily. (Patient not taking: Reported on 07/08/2023)   [DISCONTINUED] montelukast (SINGULAIR) 10 MG tablet TAKE 1 TABLET(10 MG) BY MOUTH AT BEDTIME (Patient not taking: Reported on 07/08/2023)   [DISCONTINUED] Multiple Vitamins-Minerals (MULTIVITAMIN WITH MINERALS) tablet Take 1 tablet by mouth daily. (Patient not taking: Reported on 10/25/2022)   No facility-administered encounter medications on file as of 07/08/2023.    Allergies as of 07/08/2023   (No Known Allergies)    Past Medical History:  Diagnosis Date   Aortic atherosclerosis (HCC)    Chronic pain    Diabetes mellitus without complication (HCC)    Hyperlipidemia    Hypertension  No past surgical history on file.  Family History  Problem Relation Age of Onset   Lung cancer Mother     Social History   Socioeconomic History   Marital status: Single    Spouse name: Not on file   Number of children: Not on file   Years of education: Not on file   Highest education level: Not on file  Occupational History   Not on file  Tobacco Use   Smoking status: Former    Current packs/day: 0.00    Types: Cigarettes    Quit date: 01/01/1995    Years  since quitting: 28.5   Smokeless tobacco: Never  Substance and Sexual Activity   Alcohol use: No   Drug use: Not Currently   Sexual activity: Not on file  Other Topics Concern   Not on file  Social History Narrative   Not on file   Social Determinants of Health   Financial Resource Strain: Not on file  Food Insecurity: Not on file  Transportation Needs: Not on file  Physical Activity: Not on file  Stress: Not on file  Social Connections: Not on file  Intimate Partner Violence: Not on file    Review of Systems  Constitutional:  Negative for fatigue.  Respiratory:  Positive for chest tightness and shortness of breath.   Psychiatric/Behavioral:  The patient is not nervous/anxious.     Vitals:   07/08/23 1358  BP: 130/70  Pulse: (!) 58  Temp: (!) 97.3 F (36.3 C)  SpO2: 100%     Physical Exam Constitutional:      Appearance: Normal appearance.  HENT:     Mouth/Throat:     Mouth: Mucous membranes are moist.  Eyes:     General: No scleral icterus. Cardiovascular:     Rate and Rhythm: Normal rate and regular rhythm.     Heart sounds: No murmur heard.    No friction rub.  Pulmonary:     Effort: Pulmonary effort is normal. No respiratory distress.     Breath sounds: No stridor. No wheezing or rhonchi.  Musculoskeletal:     Cervical back: No rigidity or tenderness.  Neurological:     Mental Status: He is alert.  Psychiatric:        Mood and Affect: Mood normal.    Data Reviewed: Chest x-ray reviewed with no abnormality  Pulmonary function test was within normal limits  CT scan of the chest was within normal limits   Assessment:  Multiple allergies -Continue Flonase, continue Singulair  Anxiety -Continue to use Xanax as needed -Did not tolerate Cymbalta  Continue albuterol as needed for shortness of breath  Is a reformed smoker  Plan/Recommendations: Albuterol as needed  Xanax as needed  Regular exercises as able  Follow-up in 6  months  Call with significant concerns  Virl Diamond MD [facility] 07/08/2023, 2:03 PM  CC: Darrow Bussing, MD

## 2023-07-08 NOTE — Patient Instructions (Signed)
Continue graded activities as tolerated  Xanax for anxiety as needed Albuterol for shortness of breath and wheezing  Call us with significant concerns  Follow-up in 6 months

## 2023-07-09 DIAGNOSIS — H2511 Age-related nuclear cataract, right eye: Secondary | ICD-10-CM | POA: Diagnosis not present

## 2023-07-15 DIAGNOSIS — Z961 Presence of intraocular lens: Secondary | ICD-10-CM | POA: Diagnosis not present

## 2023-07-22 DIAGNOSIS — K635 Polyp of colon: Secondary | ICD-10-CM | POA: Diagnosis not present

## 2023-07-22 DIAGNOSIS — K621 Rectal polyp: Secondary | ICD-10-CM | POA: Diagnosis not present

## 2023-07-22 DIAGNOSIS — K648 Other hemorrhoids: Secondary | ICD-10-CM | POA: Diagnosis not present

## 2023-07-22 DIAGNOSIS — Z1211 Encounter for screening for malignant neoplasm of colon: Secondary | ICD-10-CM | POA: Diagnosis not present

## 2023-07-24 DIAGNOSIS — K635 Polyp of colon: Secondary | ICD-10-CM | POA: Diagnosis not present

## 2023-07-24 DIAGNOSIS — K621 Rectal polyp: Secondary | ICD-10-CM | POA: Diagnosis not present

## 2023-08-05 DIAGNOSIS — E1165 Type 2 diabetes mellitus with hyperglycemia: Secondary | ICD-10-CM | POA: Diagnosis not present

## 2023-08-05 DIAGNOSIS — E876 Hypokalemia: Secondary | ICD-10-CM | POA: Diagnosis not present

## 2023-09-17 DIAGNOSIS — E876 Hypokalemia: Secondary | ICD-10-CM | POA: Diagnosis not present

## 2023-09-17 DIAGNOSIS — E1165 Type 2 diabetes mellitus with hyperglycemia: Secondary | ICD-10-CM | POA: Diagnosis not present

## 2023-09-17 DIAGNOSIS — Z1331 Encounter for screening for depression: Secondary | ICD-10-CM | POA: Diagnosis not present

## 2023-09-17 DIAGNOSIS — Z125 Encounter for screening for malignant neoplasm of prostate: Secondary | ICD-10-CM | POA: Diagnosis not present

## 2023-09-17 DIAGNOSIS — R194 Change in bowel habit: Secondary | ICD-10-CM | POA: Diagnosis not present

## 2023-09-17 DIAGNOSIS — Z0001 Encounter for general adult medical examination with abnormal findings: Secondary | ICD-10-CM | POA: Diagnosis not present

## 2023-09-17 DIAGNOSIS — E7800 Pure hypercholesterolemia, unspecified: Secondary | ICD-10-CM | POA: Diagnosis not present

## 2023-10-16 DIAGNOSIS — H6123 Impacted cerumen, bilateral: Secondary | ICD-10-CM | POA: Diagnosis not present

## 2023-10-22 DIAGNOSIS — E119 Type 2 diabetes mellitus without complications: Secondary | ICD-10-CM | POA: Diagnosis not present

## 2023-10-22 DIAGNOSIS — E1169 Type 2 diabetes mellitus with other specified complication: Secondary | ICD-10-CM | POA: Diagnosis not present

## 2023-11-04 ENCOUNTER — Ambulatory Visit
Admission: RE | Admit: 2023-11-04 | Discharge: 2023-11-04 | Disposition: A | Discharge status: Home / Self Care | Payer: Medicare HMO | Source: Ambulatory Visit | Attending: Nurse Practitioner | Admitting: Nurse Practitioner

## 2023-11-04 ENCOUNTER — Other Ambulatory Visit: Payer: Self-pay | Admitting: Nurse Practitioner

## 2023-11-04 DIAGNOSIS — K5900 Constipation, unspecified: Secondary | ICD-10-CM | POA: Diagnosis not present

## 2023-11-04 DIAGNOSIS — R140 Abdominal distension (gaseous): Secondary | ICD-10-CM | POA: Diagnosis not present

## 2023-11-04 DIAGNOSIS — K219 Gastro-esophageal reflux disease without esophagitis: Secondary | ICD-10-CM | POA: Diagnosis not present

## 2023-12-30 DIAGNOSIS — K219 Gastro-esophageal reflux disease without esophagitis: Secondary | ICD-10-CM | POA: Diagnosis not present

## 2024-01-05 ENCOUNTER — Encounter: Payer: Self-pay | Admitting: Pulmonary Disease

## 2024-01-05 ENCOUNTER — Ambulatory Visit: Payer: Medicare HMO | Admitting: Pulmonary Disease

## 2024-01-05 VITALS — BP 144/75 | HR 62 | Ht 71.0 in | Wt 167.0 lb

## 2024-01-05 DIAGNOSIS — F419 Anxiety disorder, unspecified: Secondary | ICD-10-CM | POA: Diagnosis not present

## 2024-01-05 DIAGNOSIS — R0602 Shortness of breath: Secondary | ICD-10-CM

## 2024-01-05 MED ORDER — ALPRAZOLAM 0.5 MG PO TABS: ORAL_TABLET | ORAL | 3 refills | Status: DC

## 2024-01-05 NOTE — Progress Notes (Signed)
 Mike Hart    [id number]    September 28, 1956  Primary Care Physician:Koirala, Dibas, MD  Referring Physician: Darrow Bussing, MD [address] [place],  [place] [address]  Chief complaint:   Shortness of breath, allergies  HPI:  Doing okay  Recently started taking medications to help clean out his bowels more efficiently  Uses Xanax for anxiety Has not been needing inhalers  Still does have some chest discomfort  Treated for H. pylori infection  Not using Qvar  Has no underlying lung disease or heart disease known to him  He does have a history of hypertension, diabetes, hypercholesterolemia  Reformed smoker quit over 25 years ago less than a pack a day smoker  Not limited during the summer months but during winter and spring will notice nasal stuffiness and congestion  PFT within normal limits, CT scan within normal limits  Has been trying to stay active, exercises regularly   Outpatient Encounter Medications as of 01/05/2024  Medication Sig   albuterol (VENTOLIN HFA) 108 (90 Base) MCG/ACT inhaler Inhale 2 puffs into the lungs every 4 (four) hours as needed for wheezing or shortness of breath.   ALPRAZolam (XANAX) 0.5 MG tablet TAKE 1 TABLET(0.5 MG) BY MOUTH TWICE DAILY AS NEEDED FOR ANXIETY   amLODipine (NORVASC) 5 MG tablet Take 5 mg by mouth daily.   atorvastatin (LIPITOR) 20 MG tablet Take 20 mg by mouth daily.   baclofen (LIORESAL) 10 MG tablet Take 10 mg by mouth daily.   cetirizine (ZYRTEC) 10 MG tablet Take 10 mg by mouth daily.   escitalopram (LEXAPRO) 10 MG tablet Take 10 mg by mouth daily.   fluticasone (FLONASE) 50 MCG/ACT nasal spray Place 1 spray into both nostrils daily. For seasonal allergies   FLUZONE HIGH-DOSE QUADRIVALENT 0.7 ML SUSY    ibuprofen (ADVIL) 800 MG tablet Take 1 tablet (800 mg total) by mouth every 8 (eight) hours as needed.   lisinopril-hydrochlorothiazide (PRINZIDE,ZESTORETIC) 20-25 MG per tablet  Take 1 tablet by mouth daily.   metFORMIN (GLUCOPHAGE) 1000 MG tablet Take 1 tablet (1000 mg) by mouth every morning and 1 and 1/2 tablets (1500 mg) every evening   methocarbamol (ROBAXIN) 500 MG tablet Take 500 mg by mouth daily.   pantoprazole (PROTONIX) 40 MG tablet Take 40 mg by mouth daily.   pregabalin (LYRICA) 100 MG capsule Take 100 mg by mouth 3 (three) times daily.   No facility-administered encounter medications on file as of 01/05/2024.    Allergies as of 01/05/2024   (No Known Allergies)    Past Medical History:  Diagnosis Date   Aortic atherosclerosis (HCC)    Chronic pain    Diabetes mellitus without complication (HCC)    Hyperlipidemia    Hypertension     No past surgical history on file.  Family History  Problem Relation Age of Onset   Lung cancer Mother     Social History   Socioeconomic History   Marital status: Single    Spouse name: Not on file   Number of children: Not on file   Years of education: Not on file   Highest education level: Not on file  Occupational History   Not on file  Tobacco Use   Smoking status: Former    Current packs/day: 0.00    Types: Cigarettes    Quit date: 01/01/1995    Years since quitting: 29.0   Smokeless tobacco: Never  Substance and Sexual Activity  Alcohol use: No   Drug use: Not Currently   Sexual activity: Not on file  Other Topics Concern   Not on file  Social History Narrative   Not on file   Social Drivers of Health   Financial Resource Strain: Not on file  Food Insecurity: Not on file  Transportation Needs: Not on file  Physical Activity: Not on file  Stress: Not on file  Social Connections: Not on file  Intimate Partner Violence: Not on file    Review of Systems  Constitutional:  Negative for fatigue.  Respiratory:  Positive for chest tightness. Negative for shortness of breath.   Psychiatric/Behavioral:  The patient is not nervous/anxious.     Vitals:   01/05/24 1420  BP: (!) 144/75   Pulse: 62  SpO2: 97%     Physical Exam Constitutional:      Appearance: Normal appearance.  HENT:     Mouth/Throat:     Mouth: Mucous membranes are moist.  Eyes:     General: No scleral icterus. Cardiovascular:     Rate and Rhythm: Normal rate and regular rhythm.     Heart sounds: No murmur heard.    No friction rub.  Pulmonary:     Effort: Pulmonary effort is normal. No respiratory distress.     Breath sounds: No stridor. No wheezing or rhonchi.  Musculoskeletal:     Cervical back: No rigidity or tenderness.  Neurological:     Mental Status: He is alert.  Psychiatric:        Mood and Affect: Mood normal.    Data Reviewed: Chest x-ray reviewed with no abnormality  Pulmonary function test was within normal limits  CT scan of the chest was within normal limits   Assessment:  Multiple allergies -Continue Singulair, Flonase  Anxiety -Continue Xanax, did not tolerate Cymbalta previously  Albuterol as needed, has not been using inhalers much   Plan/Recommendations: Follow-up in 6 months  Refills for Xanax placed  Encouraged to call with significant concerns  Virl Diamond MD [facility] 01/05/2024, 2:36 PM  CC: Darrow Bussing, MD

## 2024-01-05 NOTE — Patient Instructions (Signed)
 I will see you back in 6 months  Call us with significant concerns  I did send in refills for your medications  Continue graded activities as tolerated

## 2024-03-15 ENCOUNTER — Encounter (HOSPITAL_COMMUNITY): Payer: Self-pay

## 2024-03-15 ENCOUNTER — Other Ambulatory Visit: Payer: Self-pay

## 2024-03-15 ENCOUNTER — Emergency Department (HOSPITAL_COMMUNITY)

## 2024-03-15 ENCOUNTER — Inpatient Hospital Stay (HOSPITAL_COMMUNITY)

## 2024-03-15 ENCOUNTER — Inpatient Hospital Stay (HOSPITAL_COMMUNITY): Admitting: Certified Registered"

## 2024-03-15 ENCOUNTER — Inpatient Hospital Stay (HOSPITAL_COMMUNITY)
Admission: EM | Admit: 2024-03-15 | Discharge: 2024-03-18 | DRG: 481 | Disposition: A | Discharge status: Skilled Nursing Facility | Attending: Internal Medicine | Admitting: Internal Medicine

## 2024-03-15 ENCOUNTER — Encounter (HOSPITAL_COMMUNITY)
Admission: EM | Disposition: A | Discharge status: Skilled Nursing Facility | Payer: Self-pay | Source: Home / Self Care | Attending: Internal Medicine

## 2024-03-15 DIAGNOSIS — W010XXA Fall on same level from slipping, tripping and stumbling without subsequent striking against object, initial encounter: Secondary | ICD-10-CM | POA: Diagnosis present

## 2024-03-15 DIAGNOSIS — F32A Depression, unspecified: Secondary | ICD-10-CM | POA: Diagnosis present

## 2024-03-15 DIAGNOSIS — I700 Atherosclerosis of aorta: Secondary | ICD-10-CM | POA: Diagnosis present

## 2024-03-15 DIAGNOSIS — I9581 Postprocedural hypotension: Secondary | ICD-10-CM | POA: Diagnosis not present

## 2024-03-15 DIAGNOSIS — S72002S Fracture of unspecified part of neck of left femur, sequela: Secondary | ICD-10-CM | POA: Diagnosis not present

## 2024-03-15 DIAGNOSIS — Z79899 Other long term (current) drug therapy: Secondary | ICD-10-CM | POA: Diagnosis not present

## 2024-03-15 DIAGNOSIS — Z741 Need for assistance with personal care: Secondary | ICD-10-CM | POA: Diagnosis not present

## 2024-03-15 DIAGNOSIS — S72002A Fracture of unspecified part of neck of left femur, initial encounter for closed fracture: Secondary | ICD-10-CM | POA: Diagnosis not present

## 2024-03-15 DIAGNOSIS — G8929 Other chronic pain: Secondary | ICD-10-CM | POA: Diagnosis present

## 2024-03-15 DIAGNOSIS — R609 Edema, unspecified: Secondary | ICD-10-CM | POA: Diagnosis not present

## 2024-03-15 DIAGNOSIS — M25552 Pain in left hip: Secondary | ICD-10-CM | POA: Diagnosis not present

## 2024-03-15 DIAGNOSIS — R0989 Other specified symptoms and signs involving the circulatory and respiratory systems: Secondary | ICD-10-CM | POA: Diagnosis not present

## 2024-03-15 DIAGNOSIS — F419 Anxiety disorder, unspecified: Secondary | ICD-10-CM | POA: Diagnosis present

## 2024-03-15 DIAGNOSIS — I10 Essential (primary) hypertension: Secondary | ICD-10-CM | POA: Diagnosis not present

## 2024-03-15 DIAGNOSIS — Z7984 Long term (current) use of oral hypoglycemic drugs: Secondary | ICD-10-CM | POA: Diagnosis not present

## 2024-03-15 DIAGNOSIS — W19XXXA Unspecified fall, initial encounter: Secondary | ICD-10-CM | POA: Diagnosis not present

## 2024-03-15 DIAGNOSIS — K219 Gastro-esophageal reflux disease without esophagitis: Secondary | ICD-10-CM | POA: Diagnosis not present

## 2024-03-15 DIAGNOSIS — R262 Difficulty in walking, not elsewhere classified: Secondary | ICD-10-CM | POA: Diagnosis not present

## 2024-03-15 DIAGNOSIS — E876 Hypokalemia: Secondary | ICD-10-CM | POA: Diagnosis present

## 2024-03-15 DIAGNOSIS — E871 Hypo-osmolality and hyponatremia: Secondary | ICD-10-CM | POA: Diagnosis not present

## 2024-03-15 DIAGNOSIS — Z9181 History of falling: Secondary | ICD-10-CM | POA: Diagnosis not present

## 2024-03-15 DIAGNOSIS — S72142A Displaced intertrochanteric fracture of left femur, initial encounter for closed fracture: Secondary | ICD-10-CM | POA: Diagnosis not present

## 2024-03-15 DIAGNOSIS — E785 Hyperlipidemia, unspecified: Secondary | ICD-10-CM | POA: Diagnosis present

## 2024-03-15 DIAGNOSIS — I959 Hypotension, unspecified: Secondary | ICD-10-CM | POA: Diagnosis present

## 2024-03-15 DIAGNOSIS — Z87891 Personal history of nicotine dependence: Secondary | ICD-10-CM

## 2024-03-15 DIAGNOSIS — Z043 Encounter for examination and observation following other accident: Secondary | ICD-10-CM | POA: Diagnosis not present

## 2024-03-15 DIAGNOSIS — M6259 Muscle wasting and atrophy, not elsewhere classified, multiple sites: Secondary | ICD-10-CM | POA: Diagnosis not present

## 2024-03-15 DIAGNOSIS — E119 Type 2 diabetes mellitus without complications: Secondary | ICD-10-CM | POA: Diagnosis not present

## 2024-03-15 DIAGNOSIS — Z4789 Encounter for other orthopedic aftercare: Secondary | ICD-10-CM | POA: Diagnosis not present

## 2024-03-15 DIAGNOSIS — E8809 Other disorders of plasma-protein metabolism, not elsewhere classified: Secondary | ICD-10-CM | POA: Diagnosis present

## 2024-03-15 DIAGNOSIS — R2689 Other abnormalities of gait and mobility: Secondary | ICD-10-CM | POA: Diagnosis not present

## 2024-03-15 DIAGNOSIS — M6281 Muscle weakness (generalized): Secondary | ICD-10-CM | POA: Diagnosis not present

## 2024-03-15 DIAGNOSIS — D62 Acute posthemorrhagic anemia: Secondary | ICD-10-CM | POA: Diagnosis not present

## 2024-03-15 DIAGNOSIS — S72009A Fracture of unspecified part of neck of unspecified femur, initial encounter for closed fracture: Secondary | ICD-10-CM | POA: Diagnosis present

## 2024-03-15 DIAGNOSIS — R1311 Dysphagia, oral phase: Secondary | ICD-10-CM | POA: Diagnosis not present

## 2024-03-15 HISTORY — PX: INTRAMEDULLARY (IM) NAIL INTERTROCHANTERIC: SHX5875

## 2024-03-15 LAB — CBC WITH DIFFERENTIAL/PLATELET
Abs Immature Granulocytes: 0.01 10*3/uL (ref 0.00–0.07)
Basophils Absolute: 0 10*3/uL (ref 0.0–0.1)
Basophils Relative: 0 %
Eosinophils Absolute: 0 10*3/uL (ref 0.0–0.5)
Eosinophils Relative: 0 %
HCT: 35.8 % — ABNORMAL LOW (ref 39.0–52.0)
Hemoglobin: 12.3 g/dL — ABNORMAL LOW (ref 13.0–17.0)
Immature Granulocytes: 0 %
Lymphocytes Relative: 18 %
Lymphs Abs: 1 10*3/uL (ref 0.7–4.0)
MCH: 29.8 pg (ref 26.0–34.0)
MCHC: 34.4 g/dL (ref 30.0–36.0)
MCV: 86.7 fL (ref 80.0–100.0)
Monocytes Absolute: 0.4 10*3/uL (ref 0.1–1.0)
Monocytes Relative: 8 %
Neutro Abs: 4 10*3/uL (ref 1.7–7.7)
Neutrophils Relative %: 74 %
Platelets: 203 10*3/uL (ref 150–400)
RBC: 4.13 MIL/uL — ABNORMAL LOW (ref 4.22–5.81)
RDW: 12.5 % (ref 11.5–15.5)
WBC: 5.5 10*3/uL (ref 4.0–10.5)
nRBC: 0 % (ref 0.0–0.2)

## 2024-03-15 LAB — HEMOGLOBIN A1C
Hgb A1c MFr Bld: 6.7 % — ABNORMAL HIGH (ref 4.8–5.6)
Mean Plasma Glucose: 145.59 mg/dL

## 2024-03-15 LAB — BASIC METABOLIC PANEL WITH GFR
Anion gap: 10 (ref 5–15)
BUN: 6 mg/dL — ABNORMAL LOW (ref 8–23)
CO2: 25 mmol/L (ref 22–32)
Calcium: 8.8 mg/dL — ABNORMAL LOW (ref 8.9–10.3)
Chloride: 94 mmol/L — ABNORMAL LOW (ref 98–111)
Creatinine, Ser: 0.81 mg/dL (ref 0.61–1.24)
GFR, Estimated: >60 mL/min (ref >60)
Glucose, Bld: 158 mg/dL — ABNORMAL HIGH (ref 70–99)
Potassium: 3.4 mmol/L — ABNORMAL LOW (ref 3.5–5.1)
Sodium: 129 mmol/L — ABNORMAL LOW (ref 135–145)

## 2024-03-15 LAB — HIV ANTIBODY (ROUTINE TESTING W REFLEX): HIV Screen 4th Generation wRfx: NONREACTIVE

## 2024-03-15 LAB — GLUCOSE, CAPILLARY
Glucose-Capillary: 111 mg/dL — ABNORMAL HIGH (ref 70–99)
Glucose-Capillary: 145 mg/dL — ABNORMAL HIGH (ref 70–99)
Glucose-Capillary: 162 mg/dL — ABNORMAL HIGH (ref 70–99)
Glucose-Capillary: 171 mg/dL — ABNORMAL HIGH (ref 70–99)

## 2024-03-15 LAB — TYPE AND SCREEN
ABO/RH(D): B POS
Antibody Screen: NEGATIVE

## 2024-03-15 LAB — VITAMIN D 25 HYDROXY (VIT D DEFICIENCY, FRACTURES): Vit D, 25-Hydroxy: 17.87 ng/mL — ABNORMAL LOW (ref 30–100)

## 2024-03-15 LAB — SURGICAL PCR SCREEN
MRSA, PCR: NEGATIVE
Staphylococcus aureus: NEGATIVE

## 2024-03-15 LAB — PROTIME-INR
INR: 1.1 (ref 0.8–1.2)
Prothrombin Time: 13.9 s (ref 11.4–15.2)

## 2024-03-15 SURGERY — FIXATION, FRACTURE, INTERTROCHANTERIC, WITH INTRAMEDULLARY ROD
Anesthesia: General | Site: Leg Upper | Laterality: Left

## 2024-03-15 MED ORDER — CEFAZOLIN SODIUM-DEXTROSE 2-4 GM/100ML-% IV SOLN
INTRAVENOUS | Status: AC
  Filled 2024-03-15: qty 100

## 2024-03-15 MED ORDER — FENTANYL CITRATE (PF) 250 MCG/5ML IJ SOLN
INTRAMUSCULAR | Status: AC
Start: 2024-03-15 — End: ?
  Filled 2024-03-15: qty 5

## 2024-03-15 MED ORDER — ONDANSETRON HCL 4 MG/2ML IJ SOLN
INTRAMUSCULAR | Status: AC
  Filled 2024-03-15: qty 2

## 2024-03-15 MED ORDER — ALBUTEROL SULFATE (2.5 MG/3ML) 0.083% IN NEBU: 2.5000 mg | INHALATION_SOLUTION | Freq: Four times a day (QID) | RESPIRATORY_TRACT | Status: DC | PRN

## 2024-03-15 MED ORDER — CHLORHEXIDINE GLUCONATE 0.12 % MT SOLN: 15.0000 mL | Freq: Once | OROMUCOSAL | Status: AC

## 2024-03-15 MED ORDER — PANTOPRAZOLE SODIUM 40 MG PO TBEC
40.0000 mg | DELAYED_RELEASE_TABLET | Freq: Every day | ORAL | Status: DC
  Administered 2024-03-15 – 2024-03-18 (×4): 40 mg via ORAL
  Filled 2024-03-15 (×4): qty 1

## 2024-03-15 MED ORDER — POTASSIUM CHLORIDE 10 MEQ/100ML IV SOLN
10.0000 meq | INTRAVENOUS | Status: AC
  Administered 2024-03-15 (×2): 10 meq via INTRAVENOUS
  Filled 2024-03-15 (×2): qty 100

## 2024-03-15 MED ORDER — ENOXAPARIN SODIUM 40 MG/0.4ML IJ SOSY
40.0000 mg | PREFILLED_SYRINGE | INTRAMUSCULAR | Status: DC
  Administered 2024-03-16 – 2024-03-18 (×3): 40 mg via SUBCUTANEOUS
  Filled 2024-03-15 (×3): qty 0.4

## 2024-03-15 MED ORDER — PREGABALIN 100 MG PO CAPS
100.0000 mg | ORAL_CAPSULE | Freq: Three times a day (TID) | ORAL | Status: DC
  Administered 2024-03-16 – 2024-03-18 (×7): 100 mg via ORAL
  Filled 2024-03-15 (×7): qty 1

## 2024-03-15 MED ORDER — ONDANSETRON HCL 4 MG/2ML IJ SOLN
INTRAMUSCULAR | Status: DC | PRN
  Administered 2024-03-15: 4 mg via INTRAVENOUS

## 2024-03-15 MED ORDER — ORAL CARE MOUTH RINSE: 15.0000 mL | Freq: Once | OROMUCOSAL | Status: AC

## 2024-03-15 MED ORDER — DOCUSATE SODIUM 100 MG PO CAPS
100.0000 mg | ORAL_CAPSULE | Freq: Two times a day (BID) | ORAL | Status: DC
  Administered 2024-03-15 – 2024-03-18 (×6): 100 mg via ORAL
  Filled 2024-03-15 (×6): qty 1

## 2024-03-15 MED ORDER — DOCUSATE SODIUM 100 MG PO CAPS: 100.0000 mg | ORAL_CAPSULE | Freq: Two times a day (BID) | ORAL | Status: DC

## 2024-03-15 MED ORDER — HYDROCODONE-ACETAMINOPHEN 5-325 MG PO TABS: 1.0000 | ORAL_TABLET | ORAL | Status: DC | PRN

## 2024-03-15 MED ORDER — INSULIN ASPART 100 UNIT/ML IJ SOLN
0.0000 [IU] | Freq: Three times a day (TID) | INTRAMUSCULAR | Status: DC
  Administered 2024-03-16 (×2): 1 [IU] via SUBCUTANEOUS
  Administered 2024-03-17: 2 [IU] via SUBCUTANEOUS
  Administered 2024-03-17 – 2024-03-18 (×3): 1 [IU] via SUBCUTANEOUS

## 2024-03-15 MED ORDER — MORPHINE SULFATE (PF) 2 MG/ML IV SOLN
0.5000 mg | INTRAVENOUS | Status: DC | PRN
  Administered 2024-03-15 – 2024-03-17 (×3): 1 mg via INTRAVENOUS
  Filled 2024-03-15 (×3): qty 1

## 2024-03-15 MED ORDER — FENTANYL CITRATE (PF) 250 MCG/5ML IJ SOLN
INTRAMUSCULAR | Status: DC | PRN
Start: 2024-03-15 — End: 2024-03-15
  Administered 2024-03-15 (×4): 50 ug via INTRAVENOUS

## 2024-03-15 MED ORDER — PROPOFOL 10 MG/ML IV BOLUS
INTRAVENOUS | Status: DC | PRN
  Administered 2024-03-15: 30 mg via INTRAVENOUS
  Administered 2024-03-15: 110 mg via INTRAVENOUS

## 2024-03-15 MED ORDER — CEFAZOLIN SODIUM-DEXTROSE 2-4 GM/100ML-% IV SOLN
2.0000 g | INTRAVENOUS | Status: AC
  Administered 2024-03-15: 2 g via INTRAVENOUS

## 2024-03-15 MED ORDER — METOCLOPRAMIDE HCL 5 MG PO TABS: 5.0000 mg | ORAL_TABLET | Freq: Three times a day (TID) | ORAL | Status: DC | PRN

## 2024-03-15 MED ORDER — SODIUM CHLORIDE 0.9 % IV SOLN: INTRAVENOUS | Status: DC

## 2024-03-15 MED ORDER — METFORMIN HCL 500 MG PO TABS: 1500.0000 mg | ORAL_TABLET | Freq: Every day | ORAL | Status: DC

## 2024-03-15 MED ORDER — ONDANSETRON HCL 4 MG/2ML IJ SOLN
4.0000 mg | Freq: Four times a day (QID) | INTRAMUSCULAR | Status: DC | PRN
Start: 2024-03-15 — End: 2024-03-18

## 2024-03-15 MED ORDER — ALBUTEROL SULFATE HFA 108 (90 BASE) MCG/ACT IN AERS: 2.0000 | INHALATION_SPRAY | RESPIRATORY_TRACT | Status: DC | PRN

## 2024-03-15 MED ORDER — HYDRALAZINE HCL 10 MG PO TABS: 10.0000 mg | ORAL_TABLET | Freq: Four times a day (QID) | ORAL | Status: DC | PRN

## 2024-03-15 MED ORDER — METFORMIN HCL 500 MG PO TABS
1000.0000 mg | ORAL_TABLET | Freq: Two times a day (BID) | ORAL | Status: DC
  Administered 2024-03-15: 1000 mg via ORAL
  Filled 2024-03-15: qty 2

## 2024-03-15 MED ORDER — HYDROCHLOROTHIAZIDE 25 MG PO TABS
25.0000 mg | ORAL_TABLET | Freq: Every day | ORAL | Status: DC
  Administered 2024-03-15: 25 mg via ORAL
  Filled 2024-03-15: qty 1

## 2024-03-15 MED ORDER — ALPRAZOLAM 0.5 MG PO TABS
0.5000 mg | ORAL_TABLET | Freq: Two times a day (BID) | ORAL | Status: DC | PRN
  Administered 2024-03-15: 0.5 mg via ORAL
  Filled 2024-03-15: qty 1

## 2024-03-15 MED ORDER — LIDOCAINE 2% (20 MG/ML) 5 ML SYRINGE
INTRAMUSCULAR | Status: AC
Start: 2024-03-15 — End: ?
  Filled 2024-03-15: qty 5

## 2024-03-15 MED ORDER — LISINOPRIL 20 MG PO TABS
20.0000 mg | ORAL_TABLET | Freq: Every day | ORAL | Status: DC
  Administered 2024-03-15: 20 mg via ORAL
  Filled 2024-03-15: qty 1

## 2024-03-15 MED ORDER — PROPOFOL 10 MG/ML IV BOLUS
INTRAVENOUS | Status: AC
Start: 2024-03-15 — End: ?
  Filled 2024-03-15: qty 20

## 2024-03-15 MED ORDER — ROCURONIUM BROMIDE 10 MG/ML (PF) SYRINGE
PREFILLED_SYRINGE | INTRAVENOUS | Status: DC | PRN
  Administered 2024-03-15: 10 mg via INTRAVENOUS
  Administered 2024-03-15: 50 mg via INTRAVENOUS

## 2024-03-15 MED ORDER — FENTANYL CITRATE (PF) 100 MCG/2ML IJ SOLN
INTRAMUSCULAR | Status: AC
  Filled 2024-03-15: qty 2

## 2024-03-15 MED ORDER — METOCLOPRAMIDE HCL 5 MG/ML IJ SOLN: 5.0000 mg | Freq: Three times a day (TID) | INTRAMUSCULAR | Status: DC | PRN

## 2024-03-15 MED ORDER — LISINOPRIL-HYDROCHLOROTHIAZIDE 20-25 MG PO TABS: 1.0000 | ORAL_TABLET | Freq: Every day | ORAL | Status: DC

## 2024-03-15 MED ORDER — EPHEDRINE 5 MG/ML INJ
INTRAVENOUS | Status: AC
  Filled 2024-03-15: qty 5

## 2024-03-15 MED ORDER — DIPHENHYDRAMINE HCL 12.5 MG/5ML PO ELIX: 12.5000 mg | ORAL_SOLUTION | ORAL | Status: DC | PRN

## 2024-03-15 MED ORDER — FENTANYL CITRATE PF 50 MCG/ML IJ SOSY
25.0000 ug | PREFILLED_SYRINGE | Freq: Once | INTRAMUSCULAR | Status: AC
  Administered 2024-03-15: 25 ug via INTRAVENOUS
  Filled 2024-03-15: qty 1

## 2024-03-15 MED ORDER — LIDOCAINE 2% (20 MG/ML) 5 ML SYRINGE
INTRAMUSCULAR | Status: DC | PRN
  Administered 2024-03-15: 80 mg via INTRAVENOUS

## 2024-03-15 MED ORDER — ONDANSETRON HCL 4 MG/2ML IJ SOLN
INTRAMUSCULAR | Status: AC
Start: 2024-03-15 — End: ?
  Filled 2024-03-15: qty 2

## 2024-03-15 MED ORDER — POVIDONE-IODINE 10 % EX SWAB
2.0000 | Freq: Once | CUTANEOUS | Status: AC
  Administered 2024-03-15: 2 via TOPICAL

## 2024-03-15 MED ORDER — SENNA 8.6 MG PO TABS
1.0000 | ORAL_TABLET | Freq: Two times a day (BID) | ORAL | Status: DC
  Administered 2024-03-15 – 2024-03-18 (×6): 8.6 mg via ORAL
  Filled 2024-03-15 (×6): qty 1

## 2024-03-15 MED ORDER — FENTANYL CITRATE (PF) 100 MCG/2ML IJ SOLN
25.0000 ug | INTRAMUSCULAR | Status: DC | PRN
  Administered 2024-03-15 (×2): 50 ug via INTRAVENOUS

## 2024-03-15 MED ORDER — ESCITALOPRAM OXALATE 10 MG PO TABS
10.0000 mg | ORAL_TABLET | Freq: Every day | ORAL | Status: DC
  Administered 2024-03-16 – 2024-03-18 (×3): 10 mg via ORAL
  Filled 2024-03-15 (×3): qty 1

## 2024-03-15 MED ORDER — SUGAMMADEX SODIUM 200 MG/2ML IV SOLN
INTRAVENOUS | Status: DC | PRN
  Administered 2024-03-15: 300 mg via INTRAVENOUS

## 2024-03-15 MED ORDER — 0.9 % SODIUM CHLORIDE (POUR BTL) OPTIME
TOPICAL | Status: DC | PRN
  Administered 2024-03-15: 1000 mL

## 2024-03-15 MED ORDER — ONDANSETRON HCL 4 MG PO TABS: 4.0000 mg | ORAL_TABLET | Freq: Four times a day (QID) | ORAL | Status: DC | PRN

## 2024-03-15 MED ORDER — AMLODIPINE BESYLATE 5 MG PO TABS
5.0000 mg | ORAL_TABLET | Freq: Every day | ORAL | Status: DC
  Administered 2024-03-15: 5 mg via ORAL
  Filled 2024-03-15: qty 1

## 2024-03-15 MED ORDER — MORPHINE SULFATE (PF) 2 MG/ML IV SOLN
2.0000 mg | Freq: Once | INTRAVENOUS | Status: AC
  Administered 2024-03-15: 2 mg via INTRAVENOUS
  Filled 2024-03-15: qty 1

## 2024-03-15 MED ORDER — METHOCARBAMOL 500 MG PO TABS
500.0000 mg | ORAL_TABLET | Freq: Four times a day (QID) | ORAL | Status: DC | PRN
  Administered 2024-03-15 – 2024-03-18 (×4): 500 mg via ORAL
  Filled 2024-03-15 (×4): qty 1

## 2024-03-15 MED ORDER — TRANEXAMIC ACID-NACL 1000-0.7 MG/100ML-% IV SOLN
1000.0000 mg | Freq: Once | INTRAVENOUS | Status: AC
  Administered 2024-03-15: 1000 mg via INTRAVENOUS
  Filled 2024-03-15: qty 100

## 2024-03-15 MED ORDER — PHENYLEPHRINE HCL-NACL 20-0.9 MG/250ML-% IV SOLN
INTRAVENOUS | Status: DC | PRN
  Administered 2024-03-15: 50 ug/min via INTRAVENOUS
  Administered 2024-03-15: 100 ug via INTRAVENOUS

## 2024-03-15 MED ORDER — HYDROCODONE-ACETAMINOPHEN 7.5-325 MG PO TABS
1.0000 | ORAL_TABLET | ORAL | Status: DC | PRN
  Administered 2024-03-15: 1 via ORAL
  Administered 2024-03-15: 2 via ORAL
  Filled 2024-03-15: qty 2
  Filled 2024-03-15: qty 1

## 2024-03-15 MED ORDER — SODIUM CHLORIDE 0.9 % IV BOLUS
250.0000 mL | Freq: Once | INTRAVENOUS | Status: AC
  Administered 2024-03-15: 250 mL via INTRAVENOUS

## 2024-03-15 MED ORDER — HYDRALAZINE HCL 20 MG/ML IJ SOLN
5.0000 mg | Freq: Four times a day (QID) | INTRAMUSCULAR | Status: DC | PRN
Start: 2024-03-15 — End: 2024-03-18

## 2024-03-15 MED ORDER — OXYCODONE HCL 5 MG PO TABS: 5.0000 mg | ORAL_TABLET | Freq: Once | ORAL | Status: DC | PRN

## 2024-03-15 MED ORDER — CEFAZOLIN SODIUM-DEXTROSE 2-4 GM/100ML-% IV SOLN
2.0000 g | Freq: Three times a day (TID) | INTRAVENOUS | Status: AC
  Administered 2024-03-15 – 2024-03-16 (×3): 2 g via INTRAVENOUS
  Filled 2024-03-15 (×3): qty 100

## 2024-03-15 MED ORDER — ACETAMINOPHEN 325 MG PO TABS: 325.0000 mg | ORAL_TABLET | Freq: Four times a day (QID) | ORAL | Status: DC | PRN

## 2024-03-15 MED ORDER — CELECOXIB 200 MG PO CAPS
200.0000 mg | ORAL_CAPSULE | Freq: Two times a day (BID) | ORAL | Status: DC
  Administered 2024-03-15 – 2024-03-18 (×6): 200 mg via ORAL
  Filled 2024-03-15 (×6): qty 1

## 2024-03-15 MED ORDER — PHENYLEPHRINE 80 MCG/ML (10ML) SYRINGE FOR IV PUSH (FOR BLOOD PRESSURE SUPPORT)
PREFILLED_SYRINGE | INTRAVENOUS | Status: AC
Start: 2024-03-15 — End: ?
  Filled 2024-03-15: qty 10

## 2024-03-15 MED ORDER — METHOCARBAMOL 1000 MG/10ML IJ SOLN
500.0000 mg | Freq: Four times a day (QID) | INTRAMUSCULAR | Status: DC | PRN
Start: 2024-03-15 — End: 2024-03-18

## 2024-03-15 MED ORDER — CHLORHEXIDINE GLUCONATE 4 % EX SOLN: 60.0000 mL | Freq: Once | CUTANEOUS | Status: DC

## 2024-03-15 MED ORDER — METHOCARBAMOL 500 MG PO TABS: 500.0000 mg | ORAL_TABLET | Freq: Every day | ORAL | Status: DC

## 2024-03-15 MED ORDER — DEXAMETHASONE SODIUM PHOSPHATE 10 MG/ML IJ SOLN
INTRAMUSCULAR | Status: AC
Start: 2024-03-15 — End: ?
  Filled 2024-03-15: qty 1

## 2024-03-15 MED ORDER — PHENYLEPHRINE 80 MCG/ML (10ML) SYRINGE FOR IV PUSH (FOR BLOOD PRESSURE SUPPORT)
PREFILLED_SYRINGE | INTRAVENOUS | Status: DC | PRN
  Administered 2024-03-15: 80 ug via INTRAVENOUS

## 2024-03-15 MED ORDER — OXYCODONE HCL 5 MG/5ML PO SOLN: 5.0000 mg | Freq: Once | ORAL | Status: DC | PRN

## 2024-03-15 MED ORDER — LACTATED RINGERS IV SOLN: INTRAVENOUS | Status: DC

## 2024-03-15 MED ORDER — ATORVASTATIN CALCIUM 10 MG PO TABS
20.0000 mg | ORAL_TABLET | Freq: Every day | ORAL | Status: DC
  Administered 2024-03-15 – 2024-03-18 (×4): 20 mg via ORAL
  Filled 2024-03-15 (×4): qty 2

## 2024-03-15 MED ORDER — POLYETHYLENE GLYCOL 3350 17 G PO PACK
17.0000 g | PACK | Freq: Every day | ORAL | Status: DC | PRN
Start: 2024-03-15 — End: 2024-03-18
  Administered 2024-03-17: 17 g via ORAL
  Filled 2024-03-15: qty 1

## 2024-03-15 MED ORDER — ACETAMINOPHEN 10 MG/ML IV SOLN: 1000.0000 mg | Freq: Once | INTRAVENOUS | Status: DC | PRN

## 2024-03-15 MED ORDER — CHLORHEXIDINE GLUCONATE 0.12 % MT SOLN
OROMUCOSAL | Status: AC
  Administered 2024-03-15: 15 mL via OROMUCOSAL
  Filled 2024-03-15: qty 15

## 2024-03-15 MED ORDER — ONDANSETRON HCL 4 MG/2ML IJ SOLN: 4.0000 mg | Freq: Once | INTRAMUSCULAR | Status: DC | PRN

## 2024-03-15 SURGICAL SUPPLY — 42 items
BAG COUNTER SPONGE SURGICOUNT (BAG) IMPLANT
BIT DRILL INTERTAN LAG SCREW (BIT) IMPLANT
BIT DRILL LONG 4.0 (BIT) IMPLANT
BRUSH SCRUB EZ PLAIN DRY (MISCELLANEOUS) ×2 IMPLANT
CHLORAPREP W/TINT 26 (MISCELLANEOUS) ×1 IMPLANT
COVER MAYO STAND STRL (DRAPES) IMPLANT
COVER PERINEAL POST (MISCELLANEOUS) ×1 IMPLANT
COVER SURGICAL LIGHT HANDLE (MISCELLANEOUS) ×1 IMPLANT
DERMABOND ADVANCED .7 DNX12 (GAUZE/BANDAGES/DRESSINGS) ×1 IMPLANT
DRAPE C-ARM 35X43 STRL (DRAPES) ×1 IMPLANT
DRAPE IMP U-DRAPE 54X76 (DRAPES) ×2 IMPLANT
DRAPE INCISE IOBAN 66X45 STRL (DRAPES) ×1 IMPLANT
DRAPE STERI IOBAN 125X83 (DRAPES) ×1 IMPLANT
DRAPE SURG 17X23 STRL (DRAPES) ×2 IMPLANT
DRAPE U-SHAPE 47X51 STRL (DRAPES) ×1 IMPLANT
DRESSING MEPILEX FLEX 4X4 (GAUZE/BANDAGES/DRESSINGS) ×1 IMPLANT
DRSG MEPILEX POST OP 4X8 (GAUZE/BANDAGES/DRESSINGS) ×1 IMPLANT
DRSG TEGADERM 4X4.75 (GAUZE/BANDAGES/DRESSINGS) IMPLANT
ELECTRODE REM PT RTRN 9FT ADLT (ELECTROSURGICAL) ×1 IMPLANT
GAUZE SPONGE 4X4 12PLY STRL (GAUZE/BANDAGES/DRESSINGS) IMPLANT
GAUZE XEROFORM 5X9 LF (GAUZE/BANDAGES/DRESSINGS) IMPLANT
GLOVE BIO SURGEON STRL SZ 6.5 (GLOVE) ×3 IMPLANT
GLOVE BIO SURGEON STRL SZ7.5 (GLOVE) ×4 IMPLANT
GLOVE BIOGEL PI IND STRL 6.5 (GLOVE) ×1 IMPLANT
GLOVE BIOGEL PI IND STRL 7.5 (GLOVE) ×1 IMPLANT
GOWN STRL REUS W/ TWL LRG LVL3 (GOWN DISPOSABLE) ×1 IMPLANT
KIT BASIN OR (CUSTOM PROCEDURE TRAY) ×1 IMPLANT
KIT TURNOVER KIT B (KITS) ×1 IMPLANT
MANIFOLD NEPTUNE II (INSTRUMENTS) ×1 IMPLANT
NAIL INTERTAN 10X18 130D 10S (Nail) IMPLANT
NS IRRIG 1000ML POUR BTL (IV SOLUTION) ×1 IMPLANT
PACK GENERAL/GYN (CUSTOM PROCEDURE TRAY) ×1 IMPLANT
PAD ARMBOARD POSITIONER FOAM (MISCELLANEOUS) ×2 IMPLANT
PIN GUIDE 3.2X343MM (PIN) IMPLANT
ROD GUIDE 3.0 (MISCELLANEOUS) IMPLANT
SCREW LAG COMPR KIT 100/95 (Screw) IMPLANT
SCREW TRIGEN LOW PROF 5.0X35 (Screw) IMPLANT
SUT MNCRL AB 3-0 PS2 18 (SUTURE) ×1 IMPLANT
SUT VIC AB 0 CT1 27XBRD ANBCTR (SUTURE) IMPLANT
SUT VIC AB 2-0 CT1 TAPERPNT 27 (SUTURE) ×2 IMPLANT
TOWEL GREEN STERILE (TOWEL DISPOSABLE) ×2 IMPLANT
WATER STERILE IRR 1000ML POUR (IV SOLUTION) ×1 IMPLANT

## 2024-03-15 NOTE — Op Note (Signed)
 Orthopaedic Surgery Operative Note (CSN: [id number] ) Date of Surgery: 03/15/2024  Admit Date: 03/15/2024   Diagnoses: Pre-Op Diagnoses: Left intertrochanteric femur fracture/basicervical femoral neck fracture  Post-Op Diagnosis: Same  Procedures: CPT 27245-Cephalomedullary nailing of left intertrochanteric femur fracture  Surgeons : Primary: Laneta Pintos, MD  Assistant: Alona Jamaica, PA-C  Location: OR 3   Anesthesia: General   Antibiotics: Ancef 2g preop   Tourniquet time: None    Estimated Blood Loss: 150 mL  Complications:None   Specimens: None   Implants: Implant Name Type Inv. Item Serial No. Manufacturer Lot No. LRB No. Used Action  NAIL INTERTAN 10X18 130D 10S - [id number] Nail NAIL INTERTAN 10X18 130D 10S  [employer] [id number] Left 1 Implanted  SCREW LAG COMPR KIT 100/95 - [id number] Screw SCREW LAG COMPR KIT 100/95  [employer] [id number] Left 1 Implanted  SCREW TRIGEN LOW PROF 5.0X35 - [id number] Screw SCREW TRIGEN LOW PROF 5.0X35  [employer] [id number] Left 1 Implanted     Indications for Surgery: 68 year old male who slipped and fell sustaining a left intertrochanteric femur fracture.  Due to the unstable nature of his injury I recommend proceeding with a cephalomedullary nailing of his left hip.  Risks and benefits were discussed with the patient.  Risks included but not limited to bleeding, infection, malunion, nonunion, hardware failure, hardware irritation, nerve or blood vessel injury, DVT, even possibility anesthetic complications.  He agreed to proceed with surgery and consent was obtained.  Operative Findings: 1.  Cephalomedullary nailing of left intertrochanteric femur fracture using [employer] 10 x 180 mm InterTAN with a 100 mm lag screw and 95 mm compression screw.  Procedure: The patient was identified in the preoperative holding area. Consent was confirmed with the patient and their family  and all questions were answered. The operative extremity was marked after confirmation with the patient. he was then brought back to the operating room by our anesthesia colleagues.  He was placed under general anesthetic and carefully transferred over to the [facility] table.  All bony prominences were well-padded.  Traction was applied to the left lower extremity and fluoroscopic imaging showed the unstable nature of his injury.  The left lower extremity was then prepped and draped in usual sterile fashion.  A timeout was performed to verify the patient, the procedure, and the extremity.  Preoperative antibiotics were dosed.  For started out by making a an incision laterally to the intertrochanteric region.  I went through the IT band and used a Cobb elevator to manipulate the proximal fragment.  I was able to manipulate it back into a decent reduction.  Once I did perform this I then made a small incision proximal to the greater trochanter.  I then directed the threaded guidewire at the tip of the greater trochanter and advanced it into the proximal metaphysis.  I then used an entry reamer to enter the medullary canal.  I then passed a ball-tipped guidewire down the center of the canal.  I then tried to pass the 10 x 180 mm nail down with some of the canal.  Unfortunately it lateralized and displaced the femoral neck and proximal segment.  I then remove the nail and proceeded to use an entry reamer to medialize the entry hole to try to prevent any displacement while I placed the nail.  I then passed a 10 x 180 mm nail once again.  Fracture alignment was appropriate.  I then used the targeting arm  to direct a threaded guidewire up into the head/neck segment.  I confirmed adequate tip apex distance.  I then measured the length and chose to use 100 mm lag screw.  I then drilled the path of the compression screw and placed an antirotation bar.  I then drilled the path for the lag screw and placed the lag screw.  I then  placed the compression screw compressed approximately 7 mm.  Good fixation was obtained and I statically locked the proximal portion of the nail.  I then used the targeting arm to place a lateral to medial distal interlocking screw.  The targeting arm was removed and final fluoroscopic imaging was obtained.  The incisions were irrigated and closed with 2-0 Monocryl and Dermabond.  Sterile dressings were applied.  The patient was then awoke from anesthesia and taken to the PACU in stable condition.  Post Op Plan/Instructions: Patient be weightbearing as tolerated to the left lower extremity.  He will receive postoperative Ancef.  He will receive Lovenox for DVT prophylaxis and discharged home on aspirin.  Will have him mobilize with physical and Occupational Therapy.  I was present and performed the entire surgery.  Alona Jamaica, PA-C did assist me throughout the case. An assistant was necessary given the difficulty in approach, maintenance of reduction and ability to instrument the fracture.   Katheryne Pane, MD [facility]

## 2024-03-15 NOTE — ED Triage Notes (Signed)
 Pt bibems from home. Mechanical fall while gardening yesterday. Unable to walk today. No thinners, denies head injury or LOC. Per ems pain in R thigh, unable to bend knee. Pain 8/10.

## 2024-03-15 NOTE — H&P (View-Only) (Signed)
 Reason for Consult:Left hip fx Referring Physician: Auston Blush Time called: 1106 Time at bedside: 1114   Mike Hart is an 68 y.o. male.  HPI: [name] was doing some gardening when he slipped on the wet ground and fell. He had immediate left hip pain and could not get up. He was brought to the ED where x-rays showed a hip fx and orthopedic surgery was consulted. He lives at home and is retired.  Past Medical History:  Diagnosis Date   Aortic atherosclerosis (HCC)    Chronic pain    Diabetes mellitus without complication (HCC)    Hyperlipidemia    Hypertension     No past surgical history on file.  Family History  Problem Relation Age of Onset   Lung cancer Mother     Social History:  reports that he quit smoking about 29 years ago. His smoking use included cigarettes. He has never used smokeless tobacco. He reports that he does not currently use drugs. He reports that he does not drink alcohol.  Allergies: No Known Allergies  Medications: I have reviewed the patient's current medications.  Results for orders placed or performed during the hospital encounter of 03/15/24 (from the past 48 hours)  Type and screen [facility]     Status: None   Collection Time: 03/15/24  9:42 AM  Result Value Ref Range   ABO/RH(D) B POS    Antibody Screen NEG    Sample Expiration      03/18/2024,2359 Performed at [facility], [address] [place], [place] [address]   Basic metabolic panel     Status: Abnormal   Collection Time: 03/15/24 10:05 AM  Result Value Ref Range   Sodium 129 (L) 135 - 145 mmol/L   Potassium 3.4 (L) 3.5 - 5.1 mmol/L   Chloride 94 (L) 98 - 111 mmol/L   CO2 25 22 - 32 mmol/L   Glucose, Bld 158 (H) 70 - 99 mg/dL    Comment: Glucose reference range applies only to samples taken after fasting for at least 8 hours.   BUN 6 (L) 8 - 23 mg/dL   Creatinine, Ser 1.91 0.61 - 1.24 mg/dL   Calcium 8.8 (L) 8.9 - 10.3 mg/dL   GFR,  Estimated >47 >82 mL/min    Comment: (NOTE) Calculated using the CKD-EPI Creatinine Equation (2021)    Anion gap 10 5 - 15    Comment: Performed at [facility], [address] [place], [place] [address]  Protime-INR     Status: None   Collection Time: 03/15/24 10:05 AM  Result Value Ref Range   Prothrombin Time 13.9 11.4 - 15.2 seconds   INR 1.1 0.8 - 1.2    Comment: (NOTE) INR goal varies based on device and disease states. Performed at [facility], [address] [place], [place] [address]     No results found.  Review of Systems  HENT:  Negative for ear discharge, ear pain, hearing loss and tinnitus.   Eyes:  Negative for photophobia and pain.  Respiratory:  Negative for cough and shortness of breath.   Cardiovascular:  Negative for chest pain.  Gastrointestinal:  Negative for abdominal pain, nausea and vomiting.  Genitourinary:  Negative for dysuria, flank pain, frequency and urgency.  Musculoskeletal:  Positive for arthralgias (Lef hip). Negative for back pain, myalgias and neck pain.  Neurological:  Negative for dizziness and headaches.  Hematological:  Does not bruise/bleed easily.  Psychiatric/Behavioral:  The patient is not nervous/anxious.    Blood pressure 118/82, pulse 72, temperature 98.2 F (36.8 C), temperature source Oral, resp. rate 12, height 5\' 11"  (1.803 m), weight 74.8 kg, SpO2 100%. Physical Exam Constitutional:      General: He is not in acute distress.    Appearance: He is well-developed. He is not diaphoretic.  HENT:     Head: Normocephalic and atraumatic.  Eyes:     General: No scleral icterus.       Right eye: No discharge.        Left eye: No discharge.     Conjunctiva/sclera: Conjunctivae normal.  Cardiovascular:     Rate and Rhythm: Normal rate and regular rhythm.  Pulmonary:     Effort: Pulmonary effort is normal. No respiratory distress.  Musculoskeletal:     Cervical back: Normal range of motion.     Comments: LLE No  traumatic wounds, ecchymosis, or rash  Mod TTP hip  No knee or ankle effusion  Knee stable to varus/ valgus and anterior/posterior stress  Sens DPN, SPN, TN intact  Motor EHL, ext, flex, evers 5/5  DP 2+, PT 2+, No significant edema  Skin:    General: Skin is warm and dry.  Neurological:     Mental Status: He is alert.  Psychiatric:        Mood and Affect: Mood normal.        Behavior: Behavior normal.     Assessment/Plan: Left hip fx -- Plan IMN today with Dr. Curtiss Dowdy. Please keep NPO.    Georganna Kin, PA-C Orthopedic Surgery [phone] 03/15/2024, 11:17 AM

## 2024-03-15 NOTE — ED Provider Notes (Signed)
 [place] EMERGENCY DEPARTMENT AT [facility] Provider Note   CSN: [id number] Arrival date & time: 03/15/24  4098     History  Chief Complaint  Patient presents with   Fall   Leg Pain    Mike Hart is a 68 y.o. male.  HPI 68 year old male history of type 2 diabetes, hypertension, hyperlipidemia, who fell yesterday while working in the yard and is complaining of left hip pain.  He was unable to get back out but was able to move himself into the house.  This morning EMS was called.  They found a left hip deformity and shortening of the leg.  No other injuries were noted or complained of.  He is not on blood thinners and denies striking his head.  He does not have any other complaints of pain or injury.  He reports taking his a.m. medications with some water several hours ago but has not had anything else to eat or drink.    Home Medications Prior to Admission medications   Medication Sig Start Date End Date Taking? Authorizing Provider  albuterol  (VENTOLIN  HFA) 108 (90 Base) MCG/ACT inhaler Inhale 2 puffs into the lungs every 4 (four) hours as needed for wheezing or shortness of breath. 07/08/23   Olalere, Ona Bidding A, MD  ALPRAZolam  (XANAX ) 0.5 MG tablet TAKE 1 TABLET(0.5 MG) BY MOUTH TWICE DAILY AS NEEDED FOR ANXIETY 01/05/24   Olalere, Adewale A, MD  amLODipine (NORVASC) 5 MG tablet Take 5 mg by mouth daily. 03/01/22   [provider]  atorvastatin (LIPITOR) 20 MG tablet Take 20 mg by mouth daily.    [provider]  baclofen (LIORESAL) 10 MG tablet Take 10 mg by mouth daily. 10/22/22   [provider]  cetirizine (ZYRTEC) 10 MG tablet Take 10 mg by mouth daily.    [provider]  escitalopram (LEXAPRO) 10 MG tablet Take 10 mg by mouth daily. 01/28/22   [provider]  fluticasone  (FLONASE) 50 MCG/ACT nasal spray Place 1 spray into both nostrils daily. For seasonal allergies    [provider]  FLUZONE HIGH-DOSE  QUADRIVALENT 0.7 ML SUSY  06/24/22   [provider]  ibuprofen  (ADVIL ) 800 MG tablet Take 1 tablet (800 mg total) by mouth every 8 (eight) hours as needed. 05/08/23   McDonald, Olive Better, DPM  lisinopril-hydrochlorothiazide (PRINZIDE,ZESTORETIC) 20-25 MG per tablet Take 1 tablet by mouth daily.    [provider]  metFORMIN (GLUCOPHAGE) 1000 MG tablet Take 1 tablet (1000 mg) by mouth every morning and 1 and 1/2 tablets (1500 mg) every evening    [provider]  methocarbamol (ROBAXIN) 500 MG tablet Take 500 mg by mouth daily. 12/19/21   [provider]  pantoprazole (PROTONIX) 40 MG tablet Take 40 mg by mouth daily. 09/24/22   [provider]  pregabalin (LYRICA) 100 MG capsule Take 100 mg by mouth 3 (three) times daily. 02/11/22   [provider]      Allergies    Patient has no known allergies.    Review of Systems   Review of Systems  Physical Exam Updated Vital Signs BP 118/82   Pulse 72   Temp 98.2 F (36.8 C) (Oral)   Resp 12   Ht 1.803 m (5\' 11" )   Wt 74.8 kg   SpO2 100%   BMI 23.01 kg/m  Physical Exam Constitutional:      General: He is not in acute distress.    Appearance: Normal appearance.  HENT:     Head: Normocephalic and atraumatic.     Right Ear: External ear normal.     Left Ear: External ear normal.     Nose: Nose normal.     Mouth/Throat:     Pharynx: Oropharynx is clear.  Eyes:     Pupils: Pupils are equal, round, and reactive to light.  Cardiovascular:     Rate and Rhythm: Normal rate and regular rhythm.     Pulses: Normal pulses.  Pulmonary:     Effort: Pulmonary effort is normal.     Breath sounds: Normal breath sounds.  Abdominal:     General: Abdomen is flat.  Musculoskeletal:     Cervical back: Normal range of motion.     Comments: Back without obvious external signs of trauma No point tenderness over cervical, thoracic, or lumbar spine Left hip with deformity and tenderness Left lower  extremity with shortening pulses and sensation intact  Skin:    General: Skin is warm and dry.  Neurological:     General: No focal deficit present.     Mental Status: He is alert.  Psychiatric:        Mood and Affect: Mood normal.     ED Results / Procedures / Treatments   Labs (all labs ordered are listed, but only abnormal results are displayed) Labs Reviewed  BASIC METABOLIC PANEL WITH GFR - Abnormal; Notable for the following components:      Result Value   Sodium 129 (*)    Potassium 3.4 (*)    Chloride 94 (*)    Glucose, Bld 158 (*)    BUN 6 (*)    Calcium 8.8 (*)    All other components within normal limits  CBC WITH DIFFERENTIAL/PLATELET - Abnormal; Notable for the following components:   RBC 4.13 (*)    Hemoglobin 12.3 (*)    HCT 35.8 (*)    All other components within normal limits  PROTIME-INR  CBC WITH DIFFERENTIAL/PLATELET  TYPE AND SCREEN    EKG EKG Interpretation Date/Time:  Monday March 15 2024 09:32:11 EDT Ventricular Rate:  71 PR Interval:  241 QRS Duration:  96 QT Interval:  403 QTC Calculation: 438 R Axis:   -34  Text Interpretation: Sinus rhythm Prolonged PR interval Left axis deviation Confirmed by Auston Blush [phone]) on 03/15/2024 9:45:56 AM  Radiology No results found.  Procedures Procedures    Medications Ordered in ED Medications  0.9 %  sodium chloride infusion (has no administration in time range)  potassium chloride 10 mEq in 100 mL IVPB (has no administration in time range)  fentaNYL (SUBLIMAZE) injection 25 mcg (25 mcg Intravenous Given 03/15/24 0953)  morphine (PF) 2 MG/ML injection 2 mg (2 mg Intravenous Given 03/15/24 1113)    ED Course/ Medical Decision Making/ A&P Clinical Course as of 03/15/24 1124  Mon Mar 15, 2024  1035 DG Hip Unilat W or Wo Pelvis 2-3 Views Left [DR]  1041 DG Hip Unilat W or Wo Pelvis 2-3 Views Left [DR]  1045 DG Hip Unilat W or Wo Pelvis 2-3 Views Left [DR]    Clinical Course User Index [DR]  Auston Blush, MD                                 Medical Decision Making Amount and/or Complexity of Data Reviewed Labs: ordered. Radiology: ordered. Decision-making details documented in ED Course.  Risk Prescription drug management.  68 year old male with mechanical fall last night.  He has been unable to bear weight on his left hip.  He describes the fall as mechanical and did not have any loss of consciousness or other prodrome that led to fall. On evaluation here he has an obvious left hip deformity and no other injuries are noted 1 left hip fracture intertrochanteric-discussed with orthopedics, Steffanie Edouard, PA-C.  Patient has been n.p.o. today.  They would like to take him to the operating room today if he can be medically cleared Care discussed with Dr. Lydia Sams on-call for hospitalist Patient has had IV pain medicine for pain control here. He is neurovascularly intact 2 patient with history of type 2 diabetes and blood glucose is 158 here Multiple mild electrolyte abnormalities with sodium 129, potassium 3.4 and chloride 94. Appears to have normal renal function In general patient is well-developed and well-nourished and does not appear to have any other acute abnormalities or problems today.  His vital signs are stable here in the ED        Final Clinical Impression(s) / ED Diagnoses Final diagnoses:  Fall, initial encounter  Closed fracture of left hip, initial encounter [facility])    Rx / DC Orders ED Discharge Orders     None         Auston Blush, MD 03/15/24 1124

## 2024-03-15 NOTE — Consult Note (Signed)
 Reason for Consult:Left hip fx Referring Physician: Auston Hart Time called: 1106 Time at bedside: 1114   Mike Hart is an 68 y.o. male.  HPI: Mike Hart was doing some gardening when he slipped on the wet ground and fell. He had immediate left hip pain and could not get up. He was brought to the ED where x-rays showed a hip fx and orthopedic surgery was consulted. He lives at home and is retired.  Past Medical History:  Diagnosis Date   Aortic atherosclerosis (HCC)    Chronic pain    Diabetes mellitus without complication (HCC)    Hyperlipidemia    Hypertension     No past surgical history on file.  Family History  Problem Relation Age of Onset   Lung cancer Mother     Social History:  reports that he quit smoking about 29 years ago. His smoking use included cigarettes. He has never used smokeless tobacco. He reports that he does not currently use drugs. He reports that he does not drink alcohol.  Allergies: No Known Allergies  Medications: I have reviewed the patient's current medications.  Results for orders placed or performed during the hospital encounter of 03/15/24 (from the past 48 hours)  Type and screen [facility]     Status: None   Collection Time: 03/15/24  9:42 AM  Result Value Ref Range   ABO/RH(D) B POS    Antibody Screen NEG    Sample Expiration      03/18/2024,2359 Performed at [facility], [address] [place], [place] [address]   Basic metabolic panel     Status: Abnormal   Collection Time: 03/15/24 10:05 AM  Result Value Ref Range   Sodium 129 (L) 135 - 145 mmol/L   Potassium 3.4 (L) 3.5 - 5.1 mmol/L   Chloride 94 (L) 98 - 111 mmol/L   CO2 25 22 - 32 mmol/L   Glucose, Bld 158 (H) 70 - 99 mg/dL    Comment: Glucose reference range applies only to samples taken after fasting for at least 8 hours.   BUN 6 (L) 8 - 23 mg/dL   Creatinine, Ser 1.91 0.61 - 1.24 mg/dL   Calcium 8.8 (L) 8.9 - 10.3 mg/dL   GFR,  Estimated >47 >82 mL/min    Comment: (NOTE) Calculated using the CKD-EPI Creatinine Equation (2021)    Anion gap 10 5 - 15    Comment: Performed at [facility], [address] [place], [place] [address]  Protime-INR     Status: None   Collection Time: 03/15/24 10:05 AM  Result Value Ref Range   Prothrombin Time 13.9 11.4 - 15.2 seconds   INR 1.1 0.8 - 1.2    Comment: (NOTE) INR goal varies based on device and disease states. Performed at [facility], [address] [place], [place] [address]     No results found.  Review of Systems  HENT:  Negative for ear discharge, ear pain, hearing loss and tinnitus.   Eyes:  Negative for photophobia and pain.  Respiratory:  Negative for cough and shortness of breath.   Cardiovascular:  Negative for chest pain.  Gastrointestinal:  Negative for abdominal pain, nausea and vomiting.  Genitourinary:  Negative for dysuria, flank pain, frequency and urgency.  Musculoskeletal:  Positive for arthralgias (Lef hip). Negative for back pain, myalgias and neck pain.  Neurological:  Negative for dizziness and headaches.  Hematological:  Does not bruise/bleed easily.  Psychiatric/Behavioral:  The patient is not nervous/anxious.    Blood pressure 118/82, pulse 72, temperature 98.2 F (36.8 C), temperature source Oral, resp. rate 12, height 5\' 11"  (1.803 m), weight 74.8 kg, SpO2 100%. Physical Exam Constitutional:      General: He is not in acute distress.    Appearance: He is well-developed. He is not diaphoretic.  HENT:     Head: Normocephalic and atraumatic.  Eyes:     General: No scleral icterus.       Right eye: No discharge.        Left eye: No discharge.     Conjunctiva/sclera: Conjunctivae normal.  Cardiovascular:     Rate and Rhythm: Normal rate and regular rhythm.  Pulmonary:     Effort: Pulmonary effort is normal. No respiratory distress.  Musculoskeletal:     Cervical back: Normal range of motion.     Comments: LLE No  traumatic wounds, ecchymosis, or rash  Mod TTP hip  No knee or ankle effusion  Knee stable to varus/ valgus and anterior/posterior stress  Sens DPN, SPN, TN intact  Motor EHL, ext, flex, evers 5/5  DP 2+, PT 2+, No significant edema  Skin:    General: Skin is warm and dry.  Neurological:     Mental Status: He is alert.  Psychiatric:        Mood and Affect: Mood normal.        Behavior: Behavior normal.     Assessment/Plan: Left hip fx -- Plan IMN today with Dr. Curtiss Hart. Please keep NPO.    Mike Kin, PA-C Orthopedic Surgery [phone] 03/15/2024, 11:17 AM

## 2024-03-15 NOTE — ED Notes (Signed)
Pt away for imaging.

## 2024-03-15 NOTE — Anesthesia Preprocedure Evaluation (Addendum)
 Anesthesia Evaluation  Patient identified by MRN, date of birth, ID band Patient awake    Reviewed: Allergy & Precautions, NPO status , Patient's Chart, lab work & pertinent test results  Airway Mallampati: II  TM Distance: >3 FB Neck ROM: Full    Dental no notable dental hx. (+) Edentulous Upper, Missing, Dental Advisory Given,    Pulmonary former smoker   Pulmonary exam normal breath sounds clear to auscultation       Cardiovascular hypertension, Pt. on medications Normal cardiovascular exam Rhythm:Regular Rate:Normal  12/2020 TTE 1. Left ventricular ejection fraction, by estimation, is 60 to 65%. The  left ventricle has normal function. The left ventricle has no regional  wall motion abnormalities. There is moderate asymmetric left ventricular  hypertrophy of the basal and septal  segments. Left ventricular diastolic parameters were normal. The average  left ventricular global longitudinal strain is -22.1 %.   2. Right ventricular systolic function is normal. The right ventricular  size is normal.   3. The mitral valve is normal in structure. No evidence of mitral valve  regurgitation. No evidence of mitral stenosis.   4. The aortic valve is tricuspid. Aortic valve regurgitation is not  visualized. No aortic stenosis is present.   5. The inferior vena cava is normal in size with greater than 50%  respiratory variability, suggesting right atrial pressure of 3 mmHg.      Neuro/Psych negative neurological ROS     GI/Hepatic ,GERD  ,,  Endo/Other  diabetes    Renal/GU Lab Results      Component                Value               Date                      NA                       129 (L)             03/15/2024                CL                       94 (L)              03/15/2024                K                        3.4 (L)             03/15/2024                CO2                      25                  03/15/2024                 BUN                      6 (L)               03/15/2024                CREATININE  0.81                03/15/2024                GFRNONAA                 >60                 03/15/2024                CALCIUM                  8.8 (L)             03/15/2024                GLUCOSE                  158 (H)             03/15/2024                Musculoskeletal negative musculoskeletal ROS (+)    Abdominal   Peds  Hematology Lab Results      Component                Value               Date                      WBC                      5.5                 03/15/2024                HGB                      12.3 (L)            03/15/2024                HCT                      35.8 (L)            03/15/2024                MCV                      86.7                03/15/2024                PLT                      203                 03/15/2024              Anesthesia Other Findings   Reproductive/Obstetrics negative OB ROS                              Anesthesia Physical Anesthesia Plan  ASA: 3  Anesthesia Plan: General   Post-op Pain Management: Ofirmev  IV (intra-op)*   Induction: Intravenous  PONV Risk Score and Plan: 3 and Treatment may vary due to age or medical condition, Ondansetron, Dexamethasone  and Midazolam  Airway  Management Planned: Oral ETT  Additional Equipment: None  Intra-op Plan:   Post-operative Plan: Extubation in OR  Informed Consent: I have reviewed the patients History and Physical, chart, labs and discussed the procedure including the risks, benefits and alternatives for the proposed anesthesia with the patient or authorized representative who has indicated his/her understanding and acceptance.     Dental advisory given  Plan Discussed with: CRNA and Surgeon  Anesthesia Plan Comments:        Anesthesia Quick Evaluation

## 2024-03-15 NOTE — H&P (Signed)
 History and Physical    Patient: Mike Hart WUJ:[id number] DOB: [birth year] DOA: 03/15/2024 DOS: the patient was seen and examined on 03/15/2024 PCP: Lanae Pinal, MD  Patient coming from: Home  Chief Complaint:  Chief Complaint  Patient presents with   Fall   Leg Pain   HPI: Mike Hart is a 68 y.o. male with medical history significant of, hyperlipidemia, hypertension, chronic pain, DM, anxiety, allergies who presents after a mechanical fall.  Patient was doing some gardening when he slipped on the wet ground and fell.  He immediately had left pain, unable to stand up.  He is very active, he can walk more than 2 to 3 miles.  He denies chest pain or shortness of breath on ambulation.  He denies headache, abdominal pain, nausea vomiting or cough. He is a retired Psychologist, sport and exercise, he used to work for [employer].  Evaluation in the ED: X-ray; left hip fracture, chest x-ray no evidence of acute cardiopulmonary process. Labs sodium 129, potassium 3.4, glucose 158, white blood cell 5.5, hemoglobin 12, EKG sinus rhythm.  The PT has been consulted and planning IMN today    Review of Systems: As mentioned in the history of present illness. All other systems reviewed and are negative. Past Medical History:  Diagnosis Date   Aortic atherosclerosis (HCC)    Chronic pain    Diabetes mellitus without complication (HCC)    Hyperlipidemia    Hypertension    No past surgical history on file. Social History:  reports that he quit smoking about 29 years ago. His smoking use included cigarettes. He has never used smokeless tobacco. He reports that he does not currently use drugs. He reports that he does not drink alcohol.  No Known Allergies  Family History  Problem Relation Age of Onset   Lung cancer Mother     Prior to Admission medications   Medication Sig Start Date End Date Taking? Authorizing Provider  albuterol  (VENTOLIN  HFA) 108 (90 Base) MCG/ACT inhaler Inhale 2 puffs into  the lungs every 4 (four) hours as needed for wheezing or shortness of breath. 07/08/23   Olalere, Ona Bidding A, MD  ALPRAZolam  (XANAX ) 0.5 MG tablet TAKE 1 TABLET(0.5 MG) BY MOUTH TWICE DAILY AS NEEDED FOR ANXIETY 01/05/24   Olalere, Adewale A, MD  amLODipine (NORVASC) 5 MG tablet Take 5 mg by mouth daily. 03/01/22   [provider]  atorvastatin (LIPITOR) 20 MG tablet Take 20 mg by mouth daily.    [provider]  baclofen (LIORESAL) 10 MG tablet Take 10 mg by mouth daily. 10/22/22   [provider]  cetirizine (ZYRTEC) 10 MG tablet Take 10 mg by mouth daily.    [provider]  escitalopram (LEXAPRO) 10 MG tablet Take 10 mg by mouth daily. 01/28/22   [provider]  fluticasone  (FLONASE) 50 MCG/ACT nasal spray Place 1 spray into both nostrils daily. For seasonal allergies    [provider]  FLUZONE HIGH-DOSE QUADRIVALENT 0.7 ML SUSY  06/24/22   [provider]  ibuprofen  (ADVIL ) 800 MG tablet Take 1 tablet (800 mg total) by mouth every 8 (eight) hours as needed. 05/08/23   McDonald, Olive Better, DPM  lisinopril-hydrochlorothiazide (PRINZIDE,ZESTORETIC) 20-25 MG per tablet Take 1 tablet by mouth daily.    [provider]  metFORMIN (GLUCOPHAGE) 1000 MG tablet Take 1 tablet (1000 mg) by mouth every morning and 1 and 1/2 tablets (1500 mg) every evening    [provider]  methocarbamol (ROBAXIN)  500 MG tablet Take 500 mg by mouth daily. 12/19/21   [provider]  pantoprazole (PROTONIX) 40 MG tablet Take 40 mg by mouth daily. 09/24/22   [provider]  pregabalin (LYRICA) 100 MG capsule Take 100 mg by mouth 3 (three) times daily. 02/11/22   [provider]    Physical Exam: Vitals:   03/15/24 0921 03/15/24 0922 03/15/24 0930 03/15/24 1000  BP: (!) 149/82  121/77 118/82  Pulse: 81  73 72  Resp: 15  16 12   Temp: 98.2 F (36.8 C)     TempSrc: Oral     SpO2: 100%  100% 100%  Weight:  74.8 kg    Height:   5\' 11"  (1.803 m)     General: Alert awake following commands Cardiovascular S1-S2 regular rhythm and rate Lungs: Clear to auscultation normal respiratory effort Abdomen: Bowel sounds present, soft nontender nondistended Extremity left lower extremity shorter  Data Reviewed:  Labs reviewed  Assessment and Plan: No notes have been filed under this hospital service. Service: Hospitalist  1-Acute moderately displaced intertrochanteric left femur fracture: Patient presented after mechanical fall, found to have a left hip fracture. Revised cardiac risk index: 3.9 risk of major cardiac event.  Low risk Orthopedic consulted and planning surgery today As needed Vicodin ordered.  Bowel regimen ordered.  DVT prophylaxis per Ortho   2-Hypertension:  awaiting med rec As needed hydralazine order Will hold lisinopril hydrochlorothiazide preop SBP 120 range.    Hyponatremia: Hold hydrochlorothiazide.  Continue with IV fluids  Hypokalemia: Replace   Diabetes: Monitor CBG. hold metformin   Advance Care Planning:   Code Status: Not on file full code  Consults: ortho  Family Communication: care discussed with patient  Severity of Illness: The appropriate patient status for this patient is INPATIENT. Inpatient status is judged to be reasonable and necessary in order to provide the required intensity of service to ensure the patient's safety. The patient's presenting symptoms, physical exam findings, and initial radiographic and laboratory data in the context of their chronic comorbidities is felt to place them at high risk for further clinical deterioration. Furthermore, it is not anticipated that the patient will be medically stable for discharge from the hospital within 2 midnights of admission.   * I certify that at the point of admission it is my clinical judgment that the patient will require inpatient hospital care spanning beyond 2 midnights from the point of admission due to high  intensity of service, high risk for further deterioration and high frequency of surveillance required.*  Author: Danette Duos, MD 03/15/2024 11:32 AM  For on call review [email or web address].

## 2024-03-15 NOTE — Interval H&P Note (Signed)
 History and Physical Interval Note:  03/15/2024 1:27 PM  Mike Hart  has presented today for surgery, with the diagnosis of LEFT HIP FX.  The various methods of treatment have been discussed with the patient and family. After consideration of risks, benefits and other options for treatment, the patient has consented to  Procedure(s): FIXATION, FRACTURE, INTERTROCHANTERIC, WITH INTRAMEDULLARY ROD (Left) as a surgical intervention.  The patient's history has been reviewed, patient examined, no change in status, stable for surgery.  I have reviewed the patient's chart and labs.  Questions were answered to the patient's satisfaction.     Dorrian Doggett P Shaquoya Cosper

## 2024-03-15 NOTE — Transfer of Care (Signed)
 Immediate Anesthesia Transfer of Care Note  Patient: Mike Hart  Procedure(s) Performed: FIXATION, FRACTURE, INTERTROCHANTERIC, WITH INTRAMEDULLARY ROD (Left: Leg Upper)  Patient Location: PACU  Anesthesia Type:General  Level of Consciousness: awake, drowsy, patient cooperative, and responds to stimulation  Airway & Oxygen Therapy: Patient Spontanous Breathing  Post-op Assessment: Report given to RN and Post -op Vital signs reviewed and stable  Post vital signs: Reviewed and stable  Last Vitals:  Vitals Value Taken Time  BP    Temp    Pulse    Resp 18 03/15/24 1521  SpO2    Vitals shown include unfiled device data.  Last Pain:  Vitals:   03/15/24 1305  TempSrc:   PainSc: 2          Complications: No notable events documented.

## 2024-03-15 NOTE — Plan of Care (Signed)
  Problem: Pain Managment: Goal: General experience of comfort will improve and/or be controlled Outcome: Not Progressing   Problem: Safety: Goal: Ability to remain free from injury will improve Outcome: Not Progressing   Problem: Skin Integrity: Goal: Risk for impaired skin integrity will decrease Outcome: Not Progressing   Problem: Activity: Goal: Risk for activity intolerance will decrease Outcome: Not Progressing   Problem: Clinical Measurements: Goal: Diagnostic test results will improve Outcome: Not Progressing   Problem: Tissue Perfusion: Goal: Adequacy of tissue perfusion will improve Outcome: Not Progressing

## 2024-03-15 NOTE — Anesthesia Procedure Notes (Addendum)
 Procedure Name: Intubation Date/Time: 03/15/2024 1:53 PM  Performed by: Claud Crumb, CRNAPre-anesthesia Checklist: Patient identified, Emergency Drugs available, Suction available and Patient being monitored Patient Re-evaluated:Patient Re-evaluated prior to induction Oxygen Delivery Method: Circle System Utilized Preoxygenation: Pre-oxygenation with 100% oxygen Induction Type: IV induction Ventilation: Mask ventilation without difficulty Laryngoscope Size: Mac and 4 Grade View: Grade II Tube type: Oral Tube size: 7.5 mm Number of attempts: 1 Airway Equipment and Method: Stylet and Oral airway Placement Confirmation: ETT inserted through vocal cords under direct vision, positive ETCO2 and breath sounds checked- equal and bilateral Secured at: 22 cm Tube secured with: Tape Dental Injury: Teeth and Oropharynx as per pre-operative assessment  Comments: Lindwood Rhody, [place] for induction/intubation

## 2024-03-16 ENCOUNTER — Encounter (HOSPITAL_COMMUNITY): Payer: Self-pay | Admitting: Student

## 2024-03-16 DIAGNOSIS — S72002A Fracture of unspecified part of neck of left femur, initial encounter for closed fracture: Secondary | ICD-10-CM | POA: Diagnosis not present

## 2024-03-16 DIAGNOSIS — W19XXXA Unspecified fall, initial encounter: Secondary | ICD-10-CM | POA: Diagnosis not present

## 2024-03-16 LAB — IRON AND TIBC
Iron: 30 ug/dL — ABNORMAL LOW (ref 45–182)
Saturation Ratios: 10 % — ABNORMAL LOW (ref 17.9–39.5)
TIBC: 288 ug/dL (ref 250–450)
UIBC: 258 ug/dL

## 2024-03-16 LAB — COMPREHENSIVE METABOLIC PANEL WITH GFR
ALT: 12 U/L (ref 0–44)
AST: 33 U/L (ref 15–41)
Albumin: 2.7 g/dL — ABNORMAL LOW (ref 3.5–5.0)
Alkaline Phosphatase: 28 U/L — ABNORMAL LOW (ref 38–126)
Anion gap: 6 (ref 5–15)
BUN: 7 mg/dL — ABNORMAL LOW (ref 8–23)
CO2: 26 mmol/L (ref 22–32)
Calcium: 7.5 mg/dL — ABNORMAL LOW (ref 8.9–10.3)
Chloride: 100 mmol/L (ref 98–111)
Creatinine, Ser: 1.06 mg/dL (ref 0.61–1.24)
GFR, Estimated: >60 mL/min (ref >60)
Glucose, Bld: 137 mg/dL — ABNORMAL HIGH (ref 70–99)
Potassium: 3.4 mmol/L — ABNORMAL LOW (ref 3.5–5.1)
Sodium: 132 mmol/L — ABNORMAL LOW (ref 135–145)
Total Bilirubin: 0.9 mg/dL (ref 0.0–1.2)
Total Protein: 5 g/dL — ABNORMAL LOW (ref 6.5–8.1)

## 2024-03-16 LAB — CBC
HCT: 27.1 % — ABNORMAL LOW (ref 39.0–52.0)
Hemoglobin: 9.3 g/dL — ABNORMAL LOW (ref 13.0–17.0)
MCH: 29.9 pg (ref 26.0–34.0)
MCHC: 34.3 g/dL (ref 30.0–36.0)
MCV: 87.1 fL (ref 80.0–100.0)
Platelets: 149 10*3/uL — ABNORMAL LOW (ref 150–400)
RBC: 3.11 MIL/uL — ABNORMAL LOW (ref 4.22–5.81)
RDW: 12.3 % (ref 11.5–15.5)
WBC: 4 10*3/uL (ref 4.0–10.5)
nRBC: 0 % (ref 0.0–0.2)

## 2024-03-16 LAB — FOLATE: Folate: 11.6 ng/mL (ref >5.9)

## 2024-03-16 LAB — RETICULOCYTES
Immature Retic Fract: 12.2 % (ref 2.3–15.9)
RBC.: 3.23 MIL/uL — ABNORMAL LOW (ref 4.22–5.81)
Retic Count, Absolute: 50.7 K/uL (ref 19.0–186.0)
Retic Ct Pct: 1.6 % (ref 0.4–3.1)

## 2024-03-16 LAB — GLUCOSE, CAPILLARY
Glucose-Capillary: 119 mg/dL — ABNORMAL HIGH (ref 70–99)
Glucose-Capillary: 169 mg/dL — ABNORMAL HIGH (ref 70–99)
Glucose-Capillary: 183 mg/dL — ABNORMAL HIGH (ref 70–99)
Glucose-Capillary: 143 mg/dL — ABNORMAL HIGH (ref 70–99)

## 2024-03-16 LAB — VITAMIN B12: Vitamin B-12: 54 pg/mL — ABNORMAL LOW (ref 180–914)

## 2024-03-16 LAB — FERRITIN: Ferritin: 74 ng/mL (ref 24–336)

## 2024-03-16 LAB — ABO/RH: ABO/RH(D): B POS

## 2024-03-16 MED ORDER — LACTATED RINGERS IV BOLUS
1000.0000 mL | Freq: Once | INTRAVENOUS | Status: AC
  Administered 2024-03-16: 1000 mL via INTRAVENOUS

## 2024-03-16 MED ORDER — HYDROCODONE-ACETAMINOPHEN 5-325 MG PO TABS
1.0000 | ORAL_TABLET | ORAL | Status: DC | PRN
  Administered 2024-03-16: 2 via ORAL
  Filled 2024-03-16: qty 2

## 2024-03-16 MED ORDER — POTASSIUM CHLORIDE CRYS ER 20 MEQ PO TBCR
40.0000 meq | EXTENDED_RELEASE_TABLET | Freq: Once | ORAL | Status: AC
  Administered 2024-03-16: 40 meq via ORAL
  Filled 2024-03-16: qty 2

## 2024-03-16 MED ORDER — SODIUM CHLORIDE 0.9 % IV BOLUS
500.0000 mL | Freq: Once | INTRAVENOUS | Status: AC
  Administered 2024-03-16: 500 mL via INTRAVENOUS

## 2024-03-16 MED ORDER — PHENYLEPHRINE HCL-NACL 20-0.9 MG/250ML-% IV SOLN
INTRAVENOUS | Status: AC
Start: 2024-03-16 — End: ?
  Filled 2024-03-16: qty 750

## 2024-03-16 MED ORDER — CALCIUM CARBONATE 1250 (500 CA) MG PO TABS
1.0000 | ORAL_TABLET | Freq: Two times a day (BID) | ORAL | Status: DC
  Administered 2024-03-16 – 2024-03-18 (×5): 1250 mg via ORAL
  Filled 2024-03-16 (×5): qty 1

## 2024-03-16 MED ORDER — SIMETHICONE 80 MG PO CHEW
80.0000 mg | CHEWABLE_TABLET | Freq: Four times a day (QID) | ORAL | Status: DC | PRN
  Administered 2024-03-16 – 2024-03-17 (×2): 80 mg via ORAL
  Filled 2024-03-16 (×2): qty 1

## 2024-03-16 MED ORDER — ENSURE PLUS HIGH PROTEIN PO LIQD
237.0000 mL | Freq: Two times a day (BID) | ORAL | Status: DC
  Administered 2024-03-16 – 2024-03-18 (×6): 237 mL via ORAL

## 2024-03-16 MED ORDER — OXYCODONE HCL 5 MG PO TABS
2.5000 mg | ORAL_TABLET | Freq: Four times a day (QID) | ORAL | Status: AC | PRN
  Administered 2024-03-16 – 2024-03-17 (×2): 2.5 mg via ORAL
  Filled 2024-03-16 (×2): qty 1

## 2024-03-16 MED ORDER — VITAMIN D (ERGOCALCIFEROL) 1.25 MG (50000 UNIT) PO CAPS
50000.0000 [IU] | ORAL_CAPSULE | ORAL | Status: DC
  Administered 2024-03-16: 50000 [IU] via ORAL
  Filled 2024-03-16: qty 1

## 2024-03-16 MED ORDER — FLUTICASONE PROPIONATE 50 MCG/ACT NA SUSP
1.0000 | NASAL | Status: DC | PRN
Start: 2024-03-16 — End: 2024-03-18

## 2024-03-16 MED ORDER — PROPOFOL 1000 MG/100ML IV EMUL
INTRAVENOUS | Status: AC
  Filled 2024-03-16: qty 200

## 2024-03-16 NOTE — TOC Initial Note (Signed)
 Transition of Care [facility]) - Initial/Assessment Note    Patient Details  Name: Mike Hart MRN: [id number] Date of Birth: 09/23/1956  Transition [facility]) CM/SW Contact:    Elspeth Hals, LCSW Phone Number: 03/16/2024, 2:23 PM  Clinical Narrative:     CSW spoke with pt regarding PT recommendation for SNF.  Pt is agreeable to this, medicare choice document provided, permission given to send out referral in hub.  Pt from home alone, no current services.  Pt reports current listed contact is no longer available.  Referral sent out in hub for SNF. PASSR requires additional information.               Expected Discharge Plan: Skilled Nursing Facility Barriers to Discharge: Continued Medical Work up, SNF Pending bed offer   Patient Goals and CMS Choice Patient states their goals for this hospitalization and ongoing recovery are:: return to work [email or web address] Compare Post Acute Care list provided to:: Patient Choice offered to / list presented to : Patient      Expected Discharge Plan and Services In-house Referral: Clinical Social Work   Post Acute Care Choice: Skilled Nursing Facility Living arrangements for the past 2 months: Single Family Home                                      Prior Living Arrangements/Services Living arrangements for the past 2 months: Single Family Home Lives with:: Self Patient language and need for interpreter reviewed:: Yes Do you feel safe going back to the place where you live?: Yes      Need for Family Participation in Patient Care: No (Comment) Care giver support system in place?: No (comment) Current home services: Other (comment) (none) Criminal Activity/Legal Involvement Pertinent to Current Situation/Hospitalization: No - Comment as needed  Activities of Daily Living      Permission Sought/Granted Permission sought to share information with : Family Supports (pt reports current listed contact no longer  available)       Permission granted to share info w AGENCY: SNF        Emotional Assessment Appearance:: Appears stated age Attitude/Demeanor/Rapport: Engaged Affect (typically observed): Appropriate, Pleasant Orientation: : Oriented to Self, Oriented to Place, Oriented to  Time, Oriented to Situation      Admission diagnosis:  Closed fracture of left hip, initial encounter (HCC) [S72.002A] Fall, initial encounter [W19.XXXA] Hip fracture (HCC) [S72.009A] Patient Active Problem List   Diagnosis Date Noted   Hip fracture (HCC) 03/15/2024   Aortic atherosclerosis (HCC)    HLD (hyperlipidemia) 12/07/2020   Benign essential HTN 12/07/2020   DM type 2, goal HbA1c < 7% (HCC) 12/07/2020   Chronic pain    Diabetes (HCC) 05/07/2013   PCP:  Lanae Pinal, MD Pharmacy:   [facility] - [address], [place] - [address] [place] [address] Phone: [phone] Fax: [phone]     Social Drivers of Health (SDOH) Social History: SDOH Screenings   Tobacco Use: Medium Risk (03/15/2024)   SDOH Interventions:     Readmission Risk Interventions     No data to display

## 2024-03-16 NOTE — Anesthesia Postprocedure Evaluation (Signed)
 Anesthesia Post Note  Patient: Mike Hart  Procedure(s) Performed: FIXATION, FRACTURE, INTERTROCHANTERIC, WITH INTRAMEDULLARY ROD (Left: Leg Upper)     Patient location during evaluation: PACU Anesthesia Type: General Level of consciousness: awake and alert Pain management: pain level controlled Vital Signs Assessment: post-procedure vital signs reviewed and stable Respiratory status: spontaneous breathing, nonlabored ventilation, respiratory function stable and patient connected to nasal cannula oxygen Cardiovascular status: blood pressure returned to baseline and stable Postop Assessment: no apparent nausea or vomiting Anesthetic complications: no   No notable events documented.  Last Vitals:  Vitals:   03/16/24 0620 03/16/24 0745  BP: 107/61 (!) 101/53  Pulse: 61 65  Resp:  16  Temp:  36.7 C  SpO2:  99%    Last Pain:  Vitals:   03/16/24 0745  TempSrc: Oral  PainSc:                  Alta Goding S

## 2024-03-16 NOTE — Evaluation (Signed)
 Physical Therapy Evaluation Patient Details Name: Mike Hart MRN: [id number] DOB: 30-Sep-1956 Today's Date: 03/16/2024  History of Present Illness  Pt is a 68 y.o. male presenting to [facility] ED after a mechanical fall with left hip fx on 03/15/24. Pt is s/p nailing of left intertrochanteric femur fx on 03/15/24. PMH is significant for HTN, HLD, DM, chronic pain, and aortic atherosclerosis.  Clinical Impression  Pt presents to PT with deficits in functional mobility, gait, balance, strength, power, endurance. Pt is limited by L hip pain and dizziness when mobilizing. Pt found to be hypotensive upon conclusion of mobilization with BP readings documented below in General Comments section. Pt will benefit from frequent mobility in an effort to improve LLE muscle activation and activity tolerance. Pt is unable to identify and post-acute caregivers. Patient will benefit from continued inpatient follow up therapy, <3 hours/day.         If plan is discharge home, recommend the following: A little help with walking and/or transfers;A lot of help with bathing/dressing/bathroom;Assistance with cooking/housework;Assist for transportation;Help with stairs or ramp for entrance   Can travel by private vehicle   Yes    Equipment Recommendations Rolling walker (2 wheels);BSC/3in1  Recommendations for Other Services       Functional Status Assessment Patient has had a recent decline in their functional status and demonstrates the ability to make significant improvements in function in a reasonable and predictable amount of time.     Precautions / Restrictions Precautions Precautions: Fall Recall of Precautions/Restrictions: Intact Restrictions Weight Bearing Restrictions Per Provider Order: Yes LLE Weight Bearing Per Provider Order: Weight bearing as tolerated      Mobility  Bed Mobility Overal bed mobility: Needs Assistance Bed Mobility: Supine to Sit     Supine to sit: Contact guard, HOB  elevated, Used rails     General bed mobility comments: increased time    Transfers Overall transfer level: Needs assistance Equipment used: Rolling walker (2 wheels) Transfers: Sit to/from Stand Sit to Stand: Min assist           General transfer comment: vebral cues for hand placement and increased trunk flexion and anterior weight shift    Ambulation/Gait Ambulation/Gait assistance: Contact guard assist Gait Distance (Feet): 3 Feet Assistive device: Rolling walker (2 wheels) Gait Pattern/deviations: Step-to pattern Gait velocity: reduced Gait velocity interpretation: <1.31 ft/sec, indicative of household ambulator   General Gait Details: slowed step-to gait, reduced stance time on LLE  Stairs            Wheelchair Mobility     Tilt Bed    Modified Rankin (Stroke Patients Only)       Balance Overall balance assessment: Needs assistance Sitting-balance support: Feet supported, Single extremity supported Sitting balance-Leahy Scale: Poor     Standing balance support: Bilateral upper extremity supported, Reliant on assistive device for balance Standing balance-Leahy Scale: Poor                               Pertinent Vitals/Pain Pain Assessment Pain Assessment: Faces Faces Pain Scale: Hurts even more Pain Location: L hip Pain Descriptors / Indicators: Grimacing Pain Intervention(s): Monitored during session    Home Living Family/patient expects to be discharged to:: Private residence Living Arrangements: Alone Available Help at Discharge:  (no caregiver support identified by the patient) Type of Home: House Home Access: Stairs to enter Entrance Stairs-Rails: Can reach both Entrance Stairs-Number of Steps: 3 Alternate Level  Stairs-Number of Steps: flight Home Layout: Two level;Laundry or work area in [employer]: None (pt is unsure if he has a RW at home)      Prior Function Prior Level of Function :  Independent/Modified Independent;Driving             Mobility Comments: ambulatory without DME, enjoys gardening       Extremity/Trunk Assessment   Upper Extremity Assessment Upper Extremity Assessment: Overall WFL for tasks assessed    Lower Extremity Assessment Lower Extremity Assessment: LLE deficits/detail LLE Deficits / Details: generalized weakness post-surgery. ROM WFL for mobility on eval.    Cervical / Trunk Assessment Cervical / Trunk Assessment: Normal  Communication   Communication Communication: No apparent difficulties    Cognition Arousal: Alert Behavior During Therapy: WFL for tasks assessed/performed   PT - Cognitive impairments: No apparent impairments                         Following commands: Intact       Cueing Cueing Techniques: Verbal cues     General Comments General comments (skin integrity, edema, etc.): pt reports lightheadedness and dizziness during mobility, BP found to be 83/58 initially in sitting, on 2nd recording BP is 97/54.    Exercises     Assessment/Plan    PT Assessment Patient needs continued PT services  PT Problem List Decreased strength;Decreased activity tolerance;Decreased balance;Decreased mobility;Decreased knowledge of use of DME;Decreased knowledge of precautions;Decreased safety awareness;Pain       PT Treatment Interventions DME instruction;Gait training;Stair training;Functional mobility training;Therapeutic activities;Therapeutic exercise;Balance training;Neuromuscular re-education;Patient/family education    PT Goals (Current goals can be found in the Care Plan section)  Acute Rehab PT Goals Patient Stated Goal: to return to independence PT Goal Formulation: With patient Time For Goal Achievement: 03/30/24 Potential to Achieve Goals: Good    Frequency Min 3X/week     Co-evaluation               AM-PAC PT "6 Clicks" Mobility  Outcome Measure Help needed turning from your back to  your side while in a flat bed without using bedrails?: A Little Help needed moving from lying on your back to sitting on the side of a flat bed without using bedrails?: A Little Help needed moving to and from a bed to a chair (including a wheelchair)?: A Little Help needed standing up from a chair using your arms (e.g., wheelchair or bedside chair)?: A Little Help needed to walk in hospital room?: Total Help needed climbing 3-5 steps with a railing? : Total 6 Click Score: 14    End of Session Equipment Utilized During Treatment: Gait belt Activity Tolerance: Patient limited by pain Patient left: in chair;with call bell/phone within reach;with chair alarm set Nurse Communication: Mobility status PT Visit Diagnosis: Other abnormalities of gait and mobility (R26.89);Muscle weakness (generalized) (M62.81);Pain Pain - Right/Left: Left Pain - part of body: Hip    Time: 1120-1140 PT Time Calculation (min) (ACUTE ONLY): 20 min   Charges:   PT Evaluation $PT Eval Low Complexity: 1 Low   PT General Charges $$ ACUTE PT VISIT: 1 Visit         Rexie Catena, PT, DPT Acute Rehabilitation Office [phone]  Rexie Catena 03/16/2024, 12:28 PM

## 2024-03-16 NOTE — Evaluation (Signed)
 Occupational Therapy Evaluation Patient Details Name: Mike Hart MRN: [id number] DOB: 1956/10/10 Today's Date: 03/16/2024   History of Present Illness   Pt is a 68 y.o. male presenting to [facility] ED after a mechanical fall with left hip fx on 03/15/24. Pt is s/p nailing of left intertrochanteric femur fx on 03/15/24. PMH is significant for HTN, HLD, DM, chronic pain, and aortic atherosclerosis.     Clinical Impressions PTA patient independent and driving. Admitted for above and presents with problem list below.  Patient in recliner upon entry, just finished with PT.  Remains hypotensive in recliner with BP 83/59 and 81/56 on multiple checks, denies symptoms in sitting but was symptomatic per PT. RN aware of BP. He requires setup to min assist for ADLs.  Pt reports having no assist at home, and will benefit from further rehab at dc.  Based on performance today, believe patient will best benefit from continued OT services acutely and after dc at inpatient setting with <3hrs/day to optimize independence, safety with ADLs and mobility.     If plan is discharge home, recommend the following:   A little help with walking and/or transfers;A lot of help with bathing/dressing/bathroom;Assistance with cooking/housework;Assist for transportation;Help with stairs or ramp for entrance     Functional Status Assessment   Patient has had a recent decline in their functional status and demonstrates the ability to make significant improvements in function in a reasonable and predictable amount of time.     Equipment Recommendations   BSC/3in1;Other (comment) (RW; TBD)     Recommendations for Other Services         Precautions/Restrictions   Precautions Precautions: Fall Recall of Precautions/Restrictions: Intact Restrictions Weight Bearing Restrictions Per Provider Order: Yes LLE Weight Bearing Per Provider Order: Weight bearing as tolerated     Mobility Bed Mobility                     Transfers                   General transfer comment: deferred d/t just transferred into recliner with PT. pt hypotensive and RN aware      Balance Overall balance assessment: Needs assistance Sitting-balance support: Feet supported, Single extremity supported Sitting balance-Leahy Scale: Poor     Standing balance support: Bilateral upper extremity supported, Reliant on assistive device for balance Standing balance-Leahy Scale: Poor                             ADL either performed or assessed with clinical judgement   ADL Overall ADL's : Needs assistance/impaired     Grooming: Set up;Sitting           Upper Body Dressing : Set up;Sitting   Lower Body Dressing: Minimal assistance;Sitting/lateral leans                 General ADL Comments: pt in chair, transfered with PT.  BP hypotensive 80s/50s RN aware     Vision   Vision Assessment?: No apparent visual deficits     Perception         Praxis         Pertinent Vitals/Pain Pain Assessment Pain Assessment: Faces Faces Pain Scale: Hurts even more Pain Location: L hip Pain Descriptors / Indicators: Grimacing Pain Intervention(s): Monitored during session, Limited activity within patient's tolerance, Repositioned     Extremity/Trunk Assessment Upper Extremity Assessment Upper Extremity Assessment: Overall [facility]  for tasks assessed   Lower Extremity Assessment Lower Extremity Assessment: Defer to PT evaluation LLE Deficits / Details: generalized weakness post-surgery. ROM WFL for mobility on eval.   Cervical / Trunk Assessment Cervical / Trunk Assessment: Normal   Communication Communication Communication: No apparent difficulties   Cognition Arousal: Alert Behavior During Therapy: WFL for tasks assessed/performed Cognition: No apparent impairments             OT - Cognition Comments: appears WFL                 Following commands: Intact        Cueing  General Comments   Cueing Techniques: Verbal cues  BP in recliner 83/59, completed a few seated exercises 81/56. RN aware   Exercises     Shoulder Instructions      Home Living Family/patient expects to be discharged to:: Private residence Living Arrangements: Alone Available Help at Discharge:  (no caregiver support identified by the patient) Type of Home: House Home Access: Stairs to enter [employer] of Steps: 3 Entrance Stairs-Rails: Can reach both Home Layout: Two level;Laundry or work area in basement Alternate Teacher, music of Steps: flight   Bathroom Shower/Tub: Chief Strategy Officer: Standard     Home Equipment: None (pt is unsure if he has a RW at home)          Prior Functioning/Environment Prior Level of Function : Independent/Modified Independent;Driving             Mobility Comments: ambulatory without DME, enjoys gardening ADLs Comments: independent ADLs, IADLs and driving    OT Problem List: Decreased strength;Decreased activity tolerance;Impaired balance (sitting and/or standing);Pain;Decreased knowledge of use of DME or AE;Decreased knowledge of precautions   OT Treatment/Interventions: Self-care/ADL training;Therapeutic exercise;DME and/or AE instruction;Therapeutic activities;Balance training;Patient/family education;Energy conservation      OT Goals(Current goals can be found in the care plan section)   Acute Rehab OT Goals Patient Stated Goal: less pain OT Goal Formulation: With patient Time For Goal Achievement: 03/30/24 Potential to Achieve Goals: Good   OT Frequency:  Min 2X/week    Co-evaluation              AM-PAC OT "6 Clicks" Daily Activity     Outcome Measure Help from another person eating meals?: None Help from another person taking care of personal grooming?: A Little Help from another person toileting, which includes using toliet, bedpan, or urinal?: A Lot Help from another  person bathing (including washing, rinsing, drying)?: A Lot Help from another person to put on and taking off regular upper body clothing?: A Little Help from another person to put on and taking off regular lower body clothing?: A Lot 6 Click Score: 16   End of Session Nurse Communication: Mobility status  Activity Tolerance: Other (comment) (hypotension) Patient left: in chair;with call bell/phone within reach  OT Visit Diagnosis: Other abnormalities of gait and mobility (R26.89);Muscle weakness (generalized) (M62.81);Pain Pain - Right/Left: Left Pain - part of body: Hip;Leg                Time: 7846-9629 OT Time Calculation (min): 24 min Charges:  OT General Charges $OT Visit: 1 Visit OT Evaluation $OT Eval Moderate Complexity: 1 Mod  Bary Boss, OT Acute Rehabilitation Services Office [phone] Secure Chat Preferred    Fredrich Jefferson 03/16/2024, 1:45 PM

## 2024-03-16 NOTE — Discharge Instructions (Signed)
 [facility] Discharge Instructions   General Discharge Instructions  WEIGHT BEARING STATUS:Weightbearing as tolerated  RANGE OF MOTION/ACTIVITY: Ok for hip and knee motion as tolerated  Wound Care: You may remove your surgical dressing on post op day 2 (Wednesday 03/17/24). Incisions can be left open to air if there is no drainage. Once the incision is completely dry and without drainage, it may be left open to air out.  Showering may begin post op day 3 (Thursday 03/18/24).  Clean incision gently with soap and water.  DVT/PE prophylaxis: Aspirin x 30 days  Diet: as you were eating previously.  Can use over the counter stool softeners and bowel preparations, such as Miralax, to help with bowel movements.  Narcotics can be constipating.  Be sure to drink plenty of fluids  PAIN MEDICATION USE AND EXPECTATIONS  You have likely been given narcotic medications to help control your pain.  After a traumatic event that results in an fracture (broken bone) with or without surgery, it is ok to use narcotic pain medications to help control one's pain.  We understand that everyone responds to pain differently and each individual patient will be evaluated on a regular basis for the continued need for narcotic medications. Ideally, narcotic medication use should last no more than 6-8 weeks (coinciding with fracture healing).   As a patient it is your responsibility as well to monitor narcotic medication use and report the amount and frequency you use these medications when you come to your office visit.   We would also advise that if you are using narcotic medications, you should take a dose prior to therapy to maximize you participation.  IF YOU ARE ON NARCOTIC MEDICATIONS IT IS NOT PERMISSIBLE TO OPERATE A MOTOR VEHICLE (MOTORCYCLE/CAR/TRUCK/MOPED) OR HEAVY MACHINERY DO NOT MIX NARCOTICS WITH OTHER CNS (CENTRAL NERVOUS SYSTEM) DEPRESSANTS SUCH AS ALCOHOL  POST-OPERATIVE OPIOID TAPER  INSTRUCTIONS: It is important to wean off of your opioid medication as soon as possible. If you do not need pain medication after your surgery it is ok to stop day one. Opioids include: Codeine, Hydrocodone(Norco, Vicodin), Oxycodone (Percocet, oxycontin ) and hydromorphone amongst others.  Long term and even short term use of opiods can cause: Increased pain response Dependence Constipation Depression Respiratory depression And more.  Withdrawal symptoms can include Flu like symptoms Nausea, vomiting And more Techniques to manage these symptoms Hydrate well Eat regular healthy meals Stay active Use relaxation techniques(deep breathing, meditating, yoga) Do Not substitute Alcohol to help with tapering If you have been on opioids for less than two weeks and do not have pain than it is ok to stop all together.  Plan to wean off of opioids This plan should start within one week post op of your fracture surgery  Maintain the same interval or time between taking each dose and first decrease the dose.  Cut the total daily intake of opioids by one tablet each day Next start to increase the time between doses. The last dose that should be eliminated is the evening dose.    STOP SMOKING OR USING NICOTINE PRODUCTS!!!!  As discussed nicotine severely impairs your body's ability to heal surgical and traumatic wounds but also impairs bone healing.  Wounds and bone heal by forming microscopic blood vessels (angiogenesis) and nicotine is a vasoconstrictor (essentially, shrinks blood vessels).  Therefore, if vasoconstriction occurs to these microscopic blood vessels they essentially disappear and are unable to deliver necessary nutrients to the healing tissue.  This is one modifiable factor  that you can do to dramatically increase your chances of healing your injury.  (This means no smoking, no nicotine gum, patches, etc)  DO NOT USE NONSTEROIDAL ANTI-INFLAMMATORY DRUGS (NSAID'S)  Using products such  as Advil  (ibuprofen ), Aleve (naproxen), Motrin  (ibuprofen ) for additional pain control during fracture healing can delay and/or prevent the healing response.  If you would like to take over the counter (OTC) medication, Tylenol  (acetaminophen ) is ok.  However, some narcotic medications that are given for pain control contain acetaminophen  as well. Therefore, you should not exceed more than 4000 mg of tylenol  in a day if you do not have liver disease.  Also note that there are may OTC medicines, such as cold medicines and allergy medicines that my contain tylenol  as well.  If you have any questions about medications and/or interactions please ask your doctor/PA or your pharmacist.      ICE AND ELEVATE INJURED/OPERATIVE EXTREMITY  Using ice and elevating the injured extremity above your heart can help with swelling and pain control.  Icing in a pulsatile fashion, such as 20 minutes on and 20 minutes off, can be followed.    Do not place ice directly on skin. Make sure there is a barrier between to skin and the ice pack.    Using frozen items such as frozen peas works well as the conform nicely to the are that needs to be iced.  USE AN ACE WRAP OR TED HOSE FOR SWELLING CONTROL  In addition to icing and elevation, Ace wraps or TED hose are used to help limit and resolve swelling.  It is recommended to use Ace wraps or TED hose until you are informed to stop.    When using Ace Wraps start the wrapping distally (farthest away from the body) and wrap proximally (closer to the body)   Example: If you had surgery on your leg or thing and you do not have a splint on, start the ace wrap at the toes and work your way up to the thigh        If you had surgery on your upper extremity and do not have a splint on, start the ace wrap at your fingers and work your way up to the upper arm   CALL THE OFFICE FOR MEDICATION REFILLS OR WITH ANY QUESTIONS/CONCERNS: [phone]   VISIT OUR WEBSITE FOR ADDITIONAL  INFORMATION: [email or web address]    Discharge Wound Care Instructions  Do NOT apply any ointments, solutions or lotions to pin sites or surgical wounds.  These prevent needed drainage and even though solutions like hydrogen peroxide kill bacteria, they also damage cells lining the pin sites that help fight infection.  Applying lotions or ointments can keep the wounds moist and can cause them to breakdown and open up as well. This can increase the risk for infection. When in doubt call the office.  Surgical incisions should be dressed daily.  If any drainage is noted, use foam dressings - These dressing supplies should be available at local medical supply stores [facility], [facility], etc) as well as Insurance claims handler ([facility], [facility], [employer], etc)  Once the incision is completely dry and without drainage, it may be left open to air out.  Showering may begin 36-48 hours later.  Cleaning gently with soap and water.   Call office for the following: Temperature greater than 101F Persistent nausea and vomiting Severe uncontrolled pain Redness, tenderness, or signs of infection (pain, swelling, redness, odor or green/yellow discharge around the site) Difficulty breathing,  headache or visual disturbances Hives Persistent dizziness or light-headedness Extreme fatigue Any other questions or concerns you may have after discharge  In an emergency, call 911 or go to an Emergency Department at a nearby hospital  OTHER HELPFUL INFORMATION  If you had a block, it will wear off between 8-24 hrs postop typically.  This is period when your pain may go from nearly zero to the pain you would have had postop without the block.  This is an abrupt transition but nothing dangerous is happening.  You may take an extra dose of narcotic when this happens.  You should wean off your narcotic medicines as soon as you are able.  Most patients will be off or using minimal narcotics before their first postop  appointment.   We suggest you use the pain medication the first night prior to going to bed, in order to ease any pain when the anesthesia wears off. You should avoid taking pain medications on an empty stomach as it will make you nauseous.  Do not drink alcoholic beverages or take illicit drugs when taking pain medications.  In most states it is against the law to drive while you are in a splint or sling.  And certainly against the law to drive while taking narcotics.  You may return to work/school in the next couple of days when you feel up to it.   Pain medication may make you constipated.  Below are a few solutions to try in this order: Decrease the amount of pain medication if you aren't having pain. Drink lots of decaffeinated fluids. Drink prune juice and/or each dried prunes  If the first 3 don't work start with additional solutions Take Colace - an over-the-counter stool softener Take Senokot - an over-the-counter laxative Take Miralax - a stronger over-the-counter laxative

## 2024-03-16 NOTE — Progress Notes (Signed)
 PROGRESS NOTE    Mike Hart  ZOX:[id number] DOB: 04-12-1956 DOA: 03/15/2024 PCP: Lanae Pinal, MD   Brief Narrative: 68 year old with past medical history significant for hyperlipidemia, hypertension, chronic pain, diabetes, anxiety, allergies who presented after a mechanical fall found to have left intertrochanteric femur fracture/basicervical femoral neck fracture.  Underwent cephalomedullary nailing of the left intertrochanteric femur fracture by Dr. Curtiss Dowdy 6/2.   Assessment & Plan:   Principal Problem:   Hip fracture (HCC)   1-Left intertrochanteric femur fracture/basicervical femoral neck fracture: - Patient presented after mechanical fall - X-ray hip: Acute, moderately displaced intertrochanteric left femur fracture  -Underwent cephalomedullary nailing of the left intertrochanteric femur fracture by Dr. Curtiss Dowdy 6/2. - Vicodin as needed for med -On Lovenox for DVT prophylaxis -Bowel regimen: Docusate and senna  Acute blood loss anemia: - Expected, postsurgery - Will check anemia panel - Continue to monitor hemoglobin  Hypertension: - Blood pressure soft, will hold Norvasc and hydrochlorothiazide and lisinopril  Hypotension: Postsurgery.  Monitor hemoglobin, IV bolus.  Hold blood pressure medication  Hypokalemia: Replace orally  Diabetes type 2: - Sliding scale insulin -Hold metformin while inpatient  Hyponatremia: - Improved with IV fluids  Vitamin D deficiency: - Replace with vitamin D 50,000 unit weekly  Hypocalcemia:  - Start calcium supplementation twice daily  Hypoalbuminemia - Start Ensure     Estimated body mass index is 23.01 kg/m as calculated from the following:   Height as of this encounter: 5\' 11"  (1.803 m).   Weight as of this encounter: 74.8 kg.   DVT prophylaxis: Lovenox Code Status: Full code Family Communication: Care discussed with patient Disposition Plan:  Status is: Inpatient Remains inpatient appropriate because:  Management of hip fracture    Consultants:  Dr. Curtiss Dowdy  Procedures:  Underwent cephalomedullary nailing of the left intertrochanteric femur fracture by Dr. Curtiss Dowdy 6/2. Antimicrobials:    Subjective: Patient is alert, complaining of gas.  He usually have a lot of gas. Reports left hip pain is controlled.  Objective: Vitals:   03/16/24 0036 03/16/24 0437 03/16/24 0620 03/16/24 0745  BP: (!) 91/54 (!) 90/48 107/61 (!) 101/53  Pulse: 63 60 61 65  Resp:  15  16  Temp:  98.2 F (36.8 C)  98.1 F (36.7 C)  TempSrc:  Oral  Oral  SpO2:  100%  99%  Weight:      Height:        Intake/Output Summary (Last 24 hours) at 03/16/2024 0800 Last data filed at 03/16/2024 0626 Gross per 24 hour  Intake 1172.7 ml  Output 1030 ml  Net 142.7 ml   Filed Weights   03/15/24 0922 03/15/24 1248  Weight: 74.8 kg 74.8 kg    Examination:  General exam: Appears calm and comfortable  Respiratory system: Clear to auscultation. Respiratory effort normal. Cardiovascular system: S1 & S2 heard, RRR. No JVD, murmurs, rubs, gallops or clicks. No pedal edema. Gastrointestinal system: Abdomen is nondistended, soft and nontender. No organomegaly or masses felt. Normal bowel sounds heard. Central nervous system: Alert and oriented. No focal neurological deficits. Extremities: Symmetric 5 x 5 power. Left hip with clean dressing   Data Reviewed: I have personally reviewed following labs and imaging studies  CBC: Recent Labs  Lab 03/15/24 1100 03/16/24 0612  WBC 5.5 4.0  NEUTROABS 4.0  --   HGB 12.3* 9.3*  HCT 35.8* 27.1*  MCV 86.7 87.1  PLT 203 149*   Basic Metabolic Panel: Recent Labs  Lab 03/15/24 1005 03/16/24 0612  NA  129* 132*  K 3.4* 3.4*  CL 94* 100  CO2 25 26  GLUCOSE 158* 137*  BUN 6* 7*  CREATININE 0.81 1.06  CALCIUM 8.8* 7.5*   GFR: Estimated Creatinine Clearance: 68.6 mL/min (by C-G formula based on SCr of 1.06 mg/dL). Liver Function Tests: Recent Labs  Lab  03/16/24 0612  AST 33  ALT 12  ALKPHOS 28*  BILITOT 0.9  PROT 5.0*  ALBUMIN 2.7*   No results for input(s): "LIPASE", "AMYLASE" in the last 168 hours. No results for input(s): "AMMONIA" in the last 168 hours. Coagulation Profile: Recent Labs  Lab 03/15/24 1005  INR 1.1   Cardiac Enzymes: No results for input(s): "CKTOTAL", "CKMB", "CKMBINDEX", "TROPONINI" in the last 168 hours. BNP (last 3 results) No results for input(s): "PROBNP" in the last 8760 hours. HbA1C: Recent Labs    03/15/24 1811  HGBA1C 6.7*   CBG: Recent Labs  Lab 03/15/24 1245 03/15/24 1539 03/15/24 1711 03/15/24 2246 03/16/24 0619  GLUCAP 111* 145* 162* 171* 119*   Lipid Profile: No results for input(s): "CHOL", "HDL", "LDLCALC", "TRIG", "CHOLHDL", "LDLDIRECT" in the last 72 hours. Thyroid  Function Tests: No results for input(s): "TSH", "T4TOTAL", "FREET4", "T3FREE", "THYROIDAB" in the last 72 hours. Anemia Panel: No results for input(s): "VITAMINB12", "FOLATE", "FERRITIN", "TIBC", "IRON", "RETICCTPCT" in the last 72 hours. Sepsis Labs: No results for input(s): "PROCALCITON", "LATICACIDVEN" in the last 168 hours.  Recent Results (from the past 240 hours)  Surgical pcr screen     Status: None   Collection Time: 03/15/24 12:14 PM   Specimen: Nasal Mucosa; Nasal Swab  Result Value Ref Range Status   MRSA, PCR NEGATIVE NEGATIVE Final   Staphylococcus aureus NEGATIVE NEGATIVE Final    Comment: (NOTE) The Xpert SA Assay (FDA approved for NASAL specimens in patients 88 years of age and older), is one component of a comprehensive surveillance program. It is not intended to diagnose infection nor to guide or monitor treatment. Performed at [facility], [address], [place], [place] [address]          Radiology Studies: DG HIP PORT UNILAT W OR W/O PELVIS 1V LEFT Result Date: 03/15/2024 CLINICAL DATA:  [id number] Fracture 96051 EXAM: DG HIP (WITH OR WITHOUT PELVIS) 1V PORT LEFT COMPARISON:   Preoperative imaging FINDINGS: Femoral intramedullary nail with trans trochanteric and distal locking screw fixation traverses comminuted proximal femur fracture. Improved fracture alignment from preoperative imaging. Recent postsurgical change includes air and edema in the soft tissues. IMPRESSION: ORIF of comminuted proximal femur fracture. No immediate postoperative complication. Electronically Signed   By: Chadwick Colonel M.D.   On: 03/15/2024 19:40   DG FEMUR MIN 2 VIEWS LEFT Result Date: 03/15/2024 CLINICAL DATA:  Elective surgery. EXAM: LEFT FEMUR 2 VIEWS COMPARISON:  Preoperative imaging FINDINGS: Five fluoroscopic spot views of the left proximal femur submitted from the operating room. Femoral intramedullary nail with trans trochanteric and distal locking screw fixation traverse proximal femur fracture. Fluoroscopy time 2 minutes 59 seconds. Dose 30.25 mGy. IMPRESSION: Intraoperative fluoroscopy during proximal femur fracture fixation. Electronically Signed   By: Chadwick Colonel M.D.   On: 03/15/2024 15:16   DG C-Arm 1-60 Min-No Report Result Date: 03/15/2024 Fluoroscopy was utilized by the requesting physician.  No radiographic interpretation.   DG Chest 1 View Result Date: 03/15/2024 CLINICAL DATA:  Status post fall.  Left femur fracture. EXAM: CHEST  1 VIEW COMPARISON:  Radiographs 12/23/2020.  CT 12/23/2020. FINDINGS: 1022 hours. Two views obtained. The heart size  and mediastinal contours are normal. The lungs are clear. There is no pleural effusion or pneumothorax. No acute osseous findings are identified. Telemetry leads overlie the chest. IMPRESSION: No evidence of acute cardiopulmonary process. Electronically Signed   By: Elmon Hagedorn M.D.   On: 03/15/2024 12:00   DG Hip Unilat W or Wo Pelvis 2-3 Views Left Result Date: 03/15/2024 CLINICAL DATA:  Hip pain and deformity after falling yesterday. Inability to ambulate. EXAM: DG HIP (WITH OR WITHOUT PELVIS) 2-3V LEFT COMPARISON:  None  Available. FINDINGS: The bones appear adequately mineralized. There is an acute, moderately displaced intertrochanteric left femur fracture with up to 2.7 cm of proximal displacement. No evidence of dislocation or pelvic fracture. The hip and sacroiliac joint spaces appear preserved. IMPRESSION: Acute, moderately displaced intertrochanteric left femur fracture. Electronically Signed   By: Elmon Hagedorn M.D.   On: 03/15/2024 11:58        Scheduled Meds:  amLODipine  5 mg Oral Daily   atorvastatin  20 mg Oral Daily   calcium carbonate  1 tablet Oral BID WC   celecoxib  200 mg Oral BID   chlorhexidine  60 mL Topical Once   docusate sodium  100 mg Oral BID   enoxaparin (LOVENOX) injection  40 mg Subcutaneous Q24H   escitalopram  10 mg Oral Daily   feeding supplement  237 mL Oral BID BM   lisinopril  20 mg Oral Daily   And   hydrochlorothiazide  25 mg Oral Daily   insulin aspart  0-6 Units Subcutaneous TID WC   metFORMIN  1,000 mg Oral BID AC   pantoprazole  40 mg Oral Daily   potassium chloride  40 mEq Oral Once   pregabalin  100 mg Oral TID   senna  1 tablet Oral BID   Vitamin D (Ergocalciferol)  50,000 Units Oral Q7 days   Continuous Infusions:  sodium chloride 75 mL/hr at 03/16/24 0035    ceFAZolin (ANCEF) IV 2 g (03/16/24 0500)     LOS: 1 day    Time spent: 35 minutes    Oneil Behney A Keagen Heinlen, MD [facility]   If 7PM-7AM, please contact night-coverage [email or web address]  03/16/2024, 8:00 AM

## 2024-03-16 NOTE — Progress Notes (Cosign Needed Addendum)
 Orthopaedic Trauma Progress Note  SUBJECTIVE: Patient doing fairly well this morning.  Denies any significant pain in the left hip.  Has not been out of bed yet since surgery.  No chest pain. No SOB. No nausea/vomiting. No other complaints.  I restarted patient's home dose medications postoperatively yesterday, BP noted to be soft overnight.  Patient required fluid bolus.  BP meds currently on hold Patient states he lives at home alone.  Has 3 steps to enter. Does not use assistive device at baseline  OBJECTIVE:  Vitals:   03/16/24 0620 03/16/24 0745  BP: 107/61 (!) 101/53  Pulse: 61 65  Resp:  16  Temp:  98.1 F (36.7 C)  SpO2:  99%    Opiates Today (MME): Today's  total administered Morphine Milligram Equivalents: 0 Opiates Yesterday (MME): Yesterday's total administered Morphine Milligram Equivalents: 129  General: Laying in bed comfortably, no acute distress. Respiratory: No increased work of breathing.  Operative Extremity (left lower extremity): Dressings clean, dry, intact.  Some soreness over the lateral hip/thigh as expected.  Compartment soft compressible.  No significant calf tenderness.  Ankle dorsiflexion/plantarflexion intact.  Able to wiggle the toes.  Endorses sensation light touch over all aspects of the foot. + DP pulse  IMAGING: Stable post op imaging.   LABS:  Results for orders placed or performed during the hospital encounter of 03/15/24 (from the past 24 hours)  Type and screen [facility]     Status: None   Collection Time: 03/15/24  9:42 AM  Result Value Ref Range   ABO/RH(D) B POS    Antibody Screen NEG    Sample Expiration      03/18/2024,2359 Performed at [facility], [address], [place], [place] [address]   Basic metabolic panel     Status: Abnormal   Collection Time: 03/15/24 10:05 AM  Result Value Ref Range   Sodium 129 (L) 135 - 145 mmol/L   Potassium 3.4 (L) 3.5 - 5.1 mmol/L   Chloride 94 (L) 98 - 111 mmol/L   CO2  25 22 - 32 mmol/L   Glucose, Bld 158 (H) 70 - 99 mg/dL   BUN 6 (L) 8 - 23 mg/dL   Creatinine, Ser 6.04 0.61 - 1.24 mg/dL   Calcium 8.8 (L) 8.9 - 10.3 mg/dL   GFR, Estimated >54 >09 mL/min   Anion gap 10 5 - 15  Protime-INR     Status: None   Collection Time: 03/15/24 10:05 AM  Result Value Ref Range   Prothrombin Time 13.9 11.4 - 15.2 seconds   INR 1.1 0.8 - 1.2  CBC with Differential/Platelet     Status: Abnormal   Collection Time: 03/15/24 11:00 AM  Result Value Ref Range   WBC 5.5 4.0 - 10.5 K/uL   RBC 4.13 (L) 4.22 - 5.81 MIL/uL   Hemoglobin 12.3 (L) 13.0 - 17.0 g/dL   HCT 81.1 (L) 91.4 - 78.2 %   MCV 86.7 80.0 - 100.0 fL   MCH 29.8 26.0 - 34.0 pg   MCHC 34.4 30.0 - 36.0 g/dL   RDW 95.6 21.3 - 08.6 %   Platelets 203 150 - 400 K/uL   nRBC 0.0 0.0 - 0.2 %   Neutrophils Relative % 74 %   Neutro Abs 4.0 1.7 - 7.7 K/uL   Lymphocytes Relative 18 %   Lymphs Abs 1.0 0.7 - 4.0 K/uL   Monocytes Relative 8 %   Monocytes Absolute 0.4 0.1 - 1.0 K/uL  Eosinophils Relative 0 %   Eosinophils Absolute 0.0 0.0 - 0.5 K/uL   Basophils Relative 0 %   Basophils Absolute 0.0 0.0 - 0.1 K/uL   Immature Granulocytes 0 %   Abs Immature Granulocytes 0.01 0.00 - 0.07 K/uL  Surgical pcr screen     Status: None   Collection Time: 03/15/24 12:14 PM   Specimen: Nasal Mucosa; Nasal Swab  Result Value Ref Range   MRSA, PCR NEGATIVE NEGATIVE   Staphylococcus aureus NEGATIVE NEGATIVE  Glucose, capillary     Status: Abnormal   Collection Time: 03/15/24 12:45 PM  Result Value Ref Range   Glucose-Capillary 111 (H) 70 - 99 mg/dL  Glucose, capillary     Status: Abnormal   Collection Time: 03/15/24  3:39 PM  Result Value Ref Range   Glucose-Capillary 145 (H) 70 - 99 mg/dL  Glucose, capillary     Status: Abnormal   Collection Time: 03/15/24  5:11 PM  Result Value Ref Range   Glucose-Capillary 162 (H) 70 - 99 mg/dL  Hemoglobin U1L     Status: Abnormal   Collection Time: 03/15/24  6:11 PM  Result  Value Ref Range   Hgb A1c MFr Bld 6.7 (H) 4.8 - 5.6 %   Mean Plasma Glucose 145.59 mg/dL  HIV Antibody (routine testing w rflx)     Status: None   Collection Time: 03/15/24  6:11 PM  Result Value Ref Range   HIV Screen 4th Generation wRfx Non Reactive Non Reactive  VITAMIN D 25 Hydroxy (Vit-D Deficiency, Fractures)     Status: Abnormal   Collection Time: 03/15/24  6:11 PM  Result Value Ref Range   Vit D, 25-Hydroxy 17.87 (L) 30 - 100 ng/mL  Glucose, capillary     Status: Abnormal   Collection Time: 03/15/24 10:46 PM  Result Value Ref Range   Glucose-Capillary 171 (H) 70 - 99 mg/dL  Comprehensive metabolic panel     Status: Abnormal   Collection Time: 03/16/24  6:12 AM  Result Value Ref Range   Sodium 132 (L) 135 - 145 mmol/L   Potassium 3.4 (L) 3.5 - 5.1 mmol/L   Chloride 100 98 - 111 mmol/L   CO2 26 22 - 32 mmol/L   Glucose, Bld 137 (H) 70 - 99 mg/dL   BUN 7 (L) 8 - 23 mg/dL   Creatinine, Ser 2.44 0.61 - 1.24 mg/dL   Calcium 7.5 (L) 8.9 - 10.3 mg/dL   Total Protein 5.0 (L) 6.5 - 8.1 g/dL   Albumin 2.7 (L) 3.5 - 5.0 g/dL   AST 33 15 - 41 U/L   ALT 12 0 - 44 U/L   Alkaline Phosphatase 28 (L) 38 - 126 U/L   Total Bilirubin 0.9 0.0 - 1.2 mg/dL   GFR, Estimated >01 >02 mL/min   Anion gap 6 5 - 15  CBC     Status: Abnormal   Collection Time: 03/16/24  6:12 AM  Result Value Ref Range   WBC 4.0 4.0 - 10.5 K/uL   RBC 3.11 (L) 4.22 - 5.81 MIL/uL   Hemoglobin 9.3 (L) 13.0 - 17.0 g/dL   HCT 72.5 (L) 36.6 - 44.0 %   MCV 87.1 80.0 - 100.0 fL   MCH 29.9 26.0 - 34.0 pg   MCHC 34.3 30.0 - 36.0 g/dL   RDW 34.7 42.5 - 95.6 %   Platelets 149 (L) 150 - 400 K/uL   nRBC 0.0 0.0 - 0.2 %  Glucose, capillary     Status:  Abnormal   Collection Time: 03/16/24  6:19 AM  Result Value Ref Range   Glucose-Capillary 119 (H) 70 - 99 mg/dL    ASSESSMENT: Mike Hart is a 68 y.o. male, 1 Day Post-Op s/p mechanical fall Procedures: CEPHALOMEDULLARY NAILING OF LEFT INTERTROCHANTERIC FEMUR  FRACTURE/BASICERVICAL FEMORAL NECK FRACTURE  CV/Blood loss: Acute blood loss anemia, Hgb 9.3 this morning.  BP remains on the softer side this morning  PLAN: Weightbearing: WBAT LLE ROM: No ROM restrictions Incisional and dressing care: Reinforce dressings as needed  Showering: Okay to begin getting incisions wet starting 03/18/2024 Orthopedic device(s): None  Pain management:  1. Tylenol  325-650 mg q 6 hours  PRN 2. Robaxin 500 mg q 6 hours PRN 3. Norco 5-325 mg q 4 hours PRN 4. Morphine 0.5-1 mg q 2 hours PRN 5. Lyrica 100 mg TID 6. Celebrex 200 mg BID VTE prophylaxis: Lovenox, SCDs ID:  Ancef 2gm post op Foley/Lines:  No foley, KVO IVFs Impediments to Fracture Healing: Vitamin D level 17, started on supplementation Dispo: PT/OT evaluation today, dispo pending.  Given patient lives alone, may require SNF at discharge.  Appreciate assistance from hospitalist team with medical management.  Okay for discharge from ortho standpoint once cleared by medicine team and therapies  D/C recommendations: - Norco, Robaxin, Tylenol  for pain control - ASA 81 mg daily x 30 days for DVT prophylaxis - Continue 50,000 units Vit D supplementation q. 7 days x 5 weeks  Follow - up plan: 2 weeks after d/c for wound check and repeat x-rays   Contact information:  Katheryne Pane MD, Alona Jamaica PA-C. After hours and holidays please check [email or web address] for group call information for [facility]   Edilia Gordon, PA-C [phone] (office) [email or web address]

## 2024-03-16 NOTE — NC FL2 (Signed)
 [place]  MEDICAID FL2 LEVEL OF CARE FORM     IDENTIFICATION  Patient Name: Mike Hart Birthdate: 09/01/1956 Sex: male Admission Date (Current Location): 03/15/2024  [facility] and [place] Number:  Producer, television/film/video and Address:  [facility], [address] [place], [place] [address]      Provider Number: [id number]  Attending Physician Name and Address:  Danette Duos, MD  Relative Name and Phone Number:       Current Level of Care: Hospital Recommended Level of Care: Skilled Nursing Facility Prior Approval Number:    Date Approved/Denied:   PASRR Number:    Discharge Plan: SNF    Current Diagnoses: Patient Active Problem List   Diagnosis Date Noted   Hip fracture (HCC) 03/15/2024   Aortic atherosclerosis (HCC)    HLD (hyperlipidemia) 12/07/2020   Benign essential HTN 12/07/2020   DM type 2, goal HbA1c < 7% (HCC) 12/07/2020   Chronic pain    Diabetes (HCC) 05/07/2013    Orientation RESPIRATION BLADDER Height & Weight     Self, Time, Situation, Place  Normal Continent Weight: 165 lb (74.8 kg) Height:  5\' 11"  (180.3 cm)  BEHAVIORAL SYMPTOMS/MOOD NEUROLOGICAL BOWEL NUTRITION STATUS      Continent Diet (see discharge summary)  AMBULATORY STATUS COMMUNICATION OF NEEDS Skin   Limited Assist Verbally Surgical wounds                       Personal Care Assistance Level of Assistance  Bathing, Feeding, Dressing Bathing Assistance: Limited assistance Feeding assistance: Independent Dressing Assistance: Limited assistance     Functional Limitations Info  Sight, Hearing, Speech Sight Info: Adequate Hearing Info: Adequate Speech Info: Adequate    SPECIAL CARE FACTORS FREQUENCY  PT (By licensed PT), OT (By licensed OT)     PT Frequency: 5x week OT Frequency: 5x week            Contractures Contractures Info: Not present    Additional Factors Info  Code Status, Allergies, Insulin Sliding Scale Code Status  Info: full Allergies Info: NKA   Insulin Sliding Scale Info: Novolog: see discharge summary       Current Medications (03/16/2024):  This is the current hospital active medication list Current Facility-Administered Medications  Medication Dose Route Frequency Provider Last Rate Last Admin   0.9 %  sodium chloride infusion   Intravenous Continuous Regalado, Belkys A, MD 100 mL/hr at 03/16/24 1248 Rate Change at 03/16/24 1248   acetaminophen  (TYLENOL ) tablet 325-650 mg  325-650 mg Oral Q6H PRN Versie Gores, PA-C       albuterol  (PROVENTIL ) (2.5 MG/3ML) 0.083% nebulizer solution 2.5 mg  2.5 mg Nebulization Q6H PRN Chen, Lydia D, RPH       ALPRAZolam  (XANAX ) tablet 0.5 mg  0.5 mg Oral BID PRN Versie Gores, PA-C   0.5 mg at 03/15/24 2137   atorvastatin (LIPITOR) tablet 20 mg  20 mg Oral Daily Alona Jamaica A, PA-C   20 mg at 03/16/24 0934   calcium carbonate (OS-CAL - dosed in mg of elemental calcium) tablet 1,250 mg  1 tablet Oral BID WC Regalado, Belkys A, MD   1,250 mg at 03/16/24 0934   celecoxib (CELEBREX) capsule 200 mg  200 mg Oral BID Alona Jamaica A, PA-C   200 mg at 03/16/24 0934   chlorhexidine (HIBICLENS) 4 % liquid 4 Application  60 mL Topical Once Regalado, Belkys A, MD  diphenhydrAMINE (BENADRYL) 12.5 MG/5ML elixir 12.5-25 mg  12.5-25 mg Oral Q4H PRN Versie Gores, PA-C       docusate sodium (COLACE) capsule 100 mg  100 mg Oral BID Regalado, Belkys A, MD   100 mg at 03/16/24 0934   enoxaparin (LOVENOX) injection 40 mg  40 mg Subcutaneous Q24H Alona Jamaica A, PA-C   40 mg at 03/16/24 0934   escitalopram (LEXAPRO) tablet 10 mg  10 mg Oral Daily Versie Gores, PA-C   10 mg at 03/16/24 0934   feeding supplement (ENSURE PLUS HIGH PROTEIN) liquid 237 mL  237 mL Oral BID BM Regalado, Belkys A, MD   237 mL at 03/16/24 1001   fluticasone  (FLONASE) 50 MCG/ACT nasal spray 1 spray  1 spray Each Nare PRN Regalado, Belkys A, MD       hydrALAZINE (APRESOLINE) injection 5 mg  5  mg Intravenous Q6H PRN Regalado, Belkys A, MD       HYDROcodone-acetaminophen  (NORCO/VICODIN) 5-325 MG per tablet 1-2 tablet  1-2 tablet Oral Q4H PRN Versie Gores, PA-C   2 tablet at 03/16/24 0941   insulin aspart (novoLOG) injection 0-6 Units  0-6 Units Subcutaneous TID WC Versie Gores, PA-C   1 Units at 03/16/24 1200   methocarbamol (ROBAXIN) tablet 500 mg  500 mg Oral Q6H PRN Versie Gores, PA-C   500 mg at 03/15/24 2100   Or   methocarbamol (ROBAXIN) injection 500 mg  500 mg Intravenous Q6H PRN Versie Gores, PA-C       metoCLOPramide (REGLAN) tablet 5-10 mg  5-10 mg Oral Q8H PRN Jonelle Neri, Sarah A, PA-C       Or   metoCLOPramide (REGLAN) injection 5-10 mg  5-10 mg Intravenous Q8H PRN Jonelle Neri, Sarah A, PA-C       morphine (PF) 2 MG/ML injection 0.5-1 mg  0.5-1 mg Intravenous Q2H PRN Versie Gores, PA-C   1 mg at 03/15/24 2014   ondansetron (ZOFRAN) tablet 4 mg  4 mg Oral Q6H PRN Versie Gores, PA-C       Or   ondansetron (ZOFRAN) injection 4 mg  4 mg Intravenous Q6H PRN McClung, Sarah A, PA-C       pantoprazole (PROTONIX) EC tablet 40 mg  40 mg Oral Daily Alona Jamaica A, PA-C   40 mg at 03/16/24 0934   polyethylene glycol (MIRALAX / GLYCOLAX) packet 17 g  17 g Oral Daily PRN Alona Jamaica A, PA-C       pregabalin (LYRICA) capsule 100 mg  100 mg Oral TID Alona Jamaica A, PA-C   100 mg at 03/16/24 0934   senna (SENOKOT) tablet 8.6 mg  1 tablet Oral BID Regalado, Belkys A, MD   8.6 mg at 03/16/24 0934   simethicone (MYLICON) chewable tablet 80 mg  80 mg Oral Q6H PRN Regalado, Belkys A, MD   80 mg at 03/16/24 0934   Vitamin D (Ergocalciferol) (DRISDOL) 1.25 MG (50000 UNIT) capsule 50,000 Units  50,000 Units Oral Q7 days Versie Gores, PA-C   50,000 Units at 03/16/24 1007     Discharge Medications: Please see discharge summary for a list of discharge medications.  Relevant Imaging Results:  Relevant Lab Results:   Additional Information SSN:  [id number]  Elspeth Hals, LCSW

## 2024-03-16 NOTE — TOC CAGE-AID Note (Signed)
 Transition of Care [facility]) - CAGE-AID Screening   Patient Details  Name: Mike Hart MRN: [id number] Date of Birth: 01-23-1956  [facility]) CM/SW Contact:    Eliette Drumwright E Ahsley Attwood, LCSW Phone Number: 03/16/2024, 9:11 AM   Clinical Narrative:    CAGE-AID Screening:    Have You Ever Felt You Ought to Cut Down on Your Drinking or Drug Use?: No Have People Annoyed You By [employer] Your Drinking Or Drug Use?: No Have You Felt Bad Or Guilty About Your Drinking Or Drug Use?: No Have You Ever Had a Drink or Used Drugs First Thing In The Morning to Steady Your Nerves or to Get Rid of a Hangover?: No CAGE-AID Score: 0  Substance Abuse Education Offered: No

## 2024-03-16 NOTE — Progress Notes (Signed)
 RE:  Mike Hart       Date of Birth: 08/30/1956      Date:   03/16/24       To Whom It May Concern:  Please be advised that the above-named patient will require a short-term nursing home stay - anticipated 30 days or less for rehabilitation and strengthening.  The plan is for return home.                 MD signature                Date

## 2024-03-17 DIAGNOSIS — S72002A Fracture of unspecified part of neck of left femur, initial encounter for closed fracture: Secondary | ICD-10-CM

## 2024-03-17 LAB — BASIC METABOLIC PANEL WITH GFR
Anion gap: 7 (ref 5–15)
BUN: 11 mg/dL (ref 8–23)
CO2: 26 mmol/L (ref 22–32)
Calcium: 8.2 mg/dL — ABNORMAL LOW (ref 8.9–10.3)
Chloride: 104 mmol/L (ref 98–111)
Creatinine, Ser: 0.82 mg/dL (ref 0.61–1.24)
GFR, Estimated: >60 mL/min (ref >60)
Glucose, Bld: 170 mg/dL — ABNORMAL HIGH (ref 70–99)
Potassium: 4 mmol/L (ref 3.5–5.1)
Sodium: 137 mmol/L (ref 135–145)

## 2024-03-17 LAB — GLUCOSE, CAPILLARY
Glucose-Capillary: 169 mg/dL — ABNORMAL HIGH (ref 70–99)
Glucose-Capillary: 210 mg/dL — ABNORMAL HIGH (ref 70–99)
Glucose-Capillary: 166 mg/dL — ABNORMAL HIGH (ref 70–99)
Glucose-Capillary: 190 mg/dL — ABNORMAL HIGH (ref 70–99)

## 2024-03-17 LAB — CBC
HCT: 25.3 % — ABNORMAL LOW (ref 39.0–52.0)
Hemoglobin: 8.6 g/dL — ABNORMAL LOW (ref 13.0–17.0)
MCH: 30.1 pg (ref 26.0–34.0)
MCHC: 34 g/dL (ref 30.0–36.0)
MCV: 88.5 fL (ref 80.0–100.0)
Platelets: 123 10*3/uL — ABNORMAL LOW (ref 150–400)
RBC: 2.86 MIL/uL — ABNORMAL LOW (ref 4.22–5.81)
RDW: 12.3 % (ref 11.5–15.5)
WBC: 4.7 10*3/uL (ref 4.0–10.5)
nRBC: 0 % (ref 0.0–0.2)

## 2024-03-17 MED ORDER — ASPIRIN 81 MG PO TBEC: 81.0000 mg | DELAYED_RELEASE_TABLET | Freq: Every day | ORAL | 0 refills | Status: AC

## 2024-03-17 MED ORDER — HYDROCODONE-ACETAMINOPHEN 5-325 MG PO TABS: 1.0000 | ORAL_TABLET | ORAL | 0 refills | Status: DC | PRN

## 2024-03-17 MED ORDER — METHOCARBAMOL 500 MG PO TABS: 500.0000 mg | ORAL_TABLET | Freq: Three times a day (TID) | ORAL | 0 refills | Status: AC | PRN

## 2024-03-17 MED ORDER — VITAMIN D (ERGOCALCIFEROL) 1.25 MG (50000 UNIT) PO CAPS: 50000.0000 [IU] | ORAL_CAPSULE | ORAL | 0 refills | Status: AC

## 2024-03-17 MED ORDER — OXYCODONE HCL 5 MG PO TABS
5.0000 mg | ORAL_TABLET | ORAL | Status: DC | PRN
  Administered 2024-03-17 – 2024-03-18 (×4): 5 mg via ORAL
  Filled 2024-03-17 (×4): qty 1

## 2024-03-17 NOTE — Progress Notes (Signed)
 PROGRESS NOTE    Mike Hart  OZH:[id number] DOB: 1956-02-01 DOA: 03/15/2024 PCP: Lanae Pinal, MD    Brief Narrative:   Mike Hart is a 68 y.o. male with past medical history significant for HTN, HLD, DM2, chronic pain, anxiety, seasonal allergies who presented to [facility] ED on 03/15/2024 with left hip pain following mechanical fall.  Workup in the ED notable for left intertrochanteric femur fracture.  Orthopedics was consulted and patient underwent cephalomedullary nailing by orthopedics, Dr. Curtiss Dowdy on 03/15/2024.  Awaiting SNF placement.  Assessment & Plan:   Left intertrochanteric femur fracture Left basicervical femoral neck fracture  Patient presenting to the ED from home via EMS after sustaining a mechanical fall while gardening.  Complaining of left hip pain, unable to ambulate.  Imaging notable for acute moderately displaced intra-articular Ehrich left femur fracture..  Orthopedics was consulted and patient underwent cephalic medullary nailing of left intertrochanteric femur fracture by Dr. Curtiss Dowdy on 03/15/2024. -- WBAT LLE --Tylenol  as needed mild pain -- Norco 5-325 mg every 4 hours as needed moderate pain -- Morphine 0.5-1 mg every 2 hours as needed severe pain -- Robaxin 500 mg p.o. every 6 hours as needed muscle spasms -- Lyrica 100 mg p.o. 3 times daily -- Celebrex 20 mg p.o. twice daily -- PT/OT recommend SNF, TOC consulted for placement -- Outpatient follow-up with orthopedics 2 weeks for repeat x-rays, wound check  Acute postoperative blood loss anemia, expected -- Hgb 12.3>9.3>8.6 -- Transfuse hemoglobin less than 7.0  Vitamin D deficiency Vitamin D 25-hydroxy level low at 17. -- Vitamin D 50,000 units p.o. weekly x 5 weeks  Hypocalcemia Calcium 8.8 on admission. -- Calcium carbonate 1250 mg p.o. twice daily  HTN On amlodipine 5 mg p.o. daily outpatient, holding due to borderline hypotension in the perioperative setting. -- Continue monitor BP  closely  HLD Atorvastatin 20 g p.o. daily  DM2 On metformin 1000 mg p.o. twice daily at home.  Hemoglobin C 6.7, well-controlled. -- Resume metformin on discharge  Anxiety/depression: -- Alprazolam  0.5 g p.o. twice daily as needed anxiety -- Lexapro 10 mg p.o. daily  Hyponatremia: Resolved Improved with IV fluid hydration. -- Na 629>528>413 -- Discontinue IV fluids  GERD -- Protonix 40 mg p.o. daily    DVT prophylaxis: enoxaparin (LOVENOX) injection 40 mg Start: 03/16/24 0800 SCDs Start: 03/15/24 1657 SCDs Start: 03/15/24 1656    Code Status: Full Code Family Communication: No family present at bedside this morning  Disposition Plan:  Level of care: Med-Surg Status is: Inpatient Remains inpatient appropriate because: Awaiting SNF placement    Consultants:  Orthopedics, Dr. Curtiss Dowdy  Procedures:  Aloe medullary nailing for left femur intertrochanteric fracture, Dr. Curtiss Dowdy 6/2  Antimicrobials:  Perioperative cefazolin 6/2 - 6/3   Subjective: Seen examined bedside, lying in bed.  No complaints this morning.  Pain well-controlled.  Awaiting SNF placement.  No questions or concerns at this time.  Denies headache, no vision changes, no dizziness, no chest pain, no palpitations, no shortness of breath, no abdominal pain, no fever/chills/night sweats, no nausea/vomiting/diarrhea, no focal weakness, no fatigue, no paresthesias.  No acute events overnight per nursing staff.  Objective: Vitals:   03/16/24 1930 03/17/24 0300 03/17/24 0816 03/17/24 1609  BP: 122/65 121/61 (!) 122/59 (!) 142/64  Pulse: 68 67 70 80  Resp:      Temp: 97.6 F (36.4 C) 97.8 F (36.6 C) 98.7 F (37.1 C) 98.8 F (37.1 C)  TempSrc: Oral Oral    SpO2: 100% 100%  100% 100%  Weight:      Height:        Intake/Output Summary (Last 24 hours) at 03/17/2024 1706 Last data filed at 03/17/2024 1426 Gross per 24 hour  Intake 2642.13 ml  Output 2980 ml  Net -337.87 ml   Filed Weights   03/15/24  0922 03/15/24 1248  Weight: 74.8 kg 74.8 kg    Examination:  Physical Exam: GEN: NAD, alert and oriented x 3, wd/wn HEENT: NCAT, PERRL, EOMI, sclera clear, MMM PULM: CTAB w/o wheezes/crackles, normal respiratory effort, on room air CV: RRR w/o M/G/R GI: abd soft, NTND, + BS MSK: no peripheral edema, moves all extremities dependently, noted surgical incision sites, no surrounding erythema/fluctuance or ecchymosis NEURO: No focal neurological deficit PSYCH: normal mood/affect Integumentary: Surgical incision site LLE as above, otherwise no other concerning rashes/wounds/wounds noted on exposed skin surfaces    Data Reviewed: I have personally reviewed following labs and imaging studies  CBC: Recent Labs  Lab 03/15/24 1100 03/16/24 0612 03/17/24 0538  WBC 5.5 4.0 4.7  NEUTROABS 4.0  --   --   HGB 12.3* 9.3* 8.6*  HCT 35.8* 27.1* 25.3*  MCV 86.7 87.1 88.5  PLT 203 149* 123*   Basic Metabolic Panel: Recent Labs  Lab 03/15/24 1005 03/16/24 0612 03/17/24 0538  NA 129* 132* 137  K 3.4* 3.4* 4.0  CL 94* 100 104  CO2 25 26 26   GLUCOSE 158* 137* 170*  BUN 6* 7* 11  CREATININE 0.81 1.06 0.82  CALCIUM 8.8* 7.5* 8.2*   GFR: Estimated Creatinine Clearance: 88.7 mL/min (by C-G formula based on SCr of 0.82 mg/dL). Liver Function Tests: Recent Labs  Lab 03/16/24 0612  AST 33  ALT 12  ALKPHOS 28*  BILITOT 0.9  PROT 5.0*  ALBUMIN 2.7*   No results for input(s): "LIPASE", "AMYLASE" in the last 168 hours. No results for input(s): "AMMONIA" in the last 168 hours. Coagulation Profile: Recent Labs  Lab 03/15/24 1005  INR 1.1   Cardiac Enzymes: No results for input(s): "CKTOTAL", "CKMB", "CKMBINDEX", "TROPONINI" in the last 168 hours. BNP (last 3 results) No results for input(s): "PROBNP" in the last 8760 hours. HbA1C: Recent Labs    03/15/24 1811  HGBA1C 6.7*   CBG: Recent Labs  Lab 03/16/24 1635 03/16/24 2128 03/17/24 0641 03/17/24 1112 03/17/24 1606   GLUCAP 183* 143* 169* 210* 166*   Lipid Profile: No results for input(s): "CHOL", "HDL", "LDLCALC", "TRIG", "CHOLHDL", "LDLDIRECT" in the last 72 hours. Thyroid  Function Tests: No results for input(s): "TSH", "T4TOTAL", "FREET4", "T3FREE", "THYROIDAB" in the last 72 hours. Anemia Panel: Recent Labs    03/16/24 1242  VITAMINB12 54*  FOLATE 11.6  FERRITIN 74  TIBC 288  IRON 30*  RETICCTPCT 1.6   Sepsis Labs: No results for input(s): "PROCALCITON", "LATICACIDVEN" in the last 168 hours.  Recent Results (from the past 240 hours)  Surgical pcr screen     Status: None   Collection Time: 03/15/24 12:14 PM   Specimen: Nasal Mucosa; Nasal Swab  Result Value Ref Range Status   MRSA, PCR NEGATIVE NEGATIVE Final   Staphylococcus aureus NEGATIVE NEGATIVE Final    Comment: (NOTE) The Xpert SA Assay (FDA approved for NASAL specimens in patients 65 years of age and older), is one component of a comprehensive surveillance program. It is not intended to diagnose infection nor to guide or monitor treatment. Performed at [facility], [address] [place], [place] [address]  Radiology Studies: No results found.      Scheduled Meds:  atorvastatin  20 mg Oral Daily   calcium carbonate  1 tablet Oral BID WC   celecoxib  200 mg Oral BID   chlorhexidine  60 mL Topical Once   docusate sodium  100 mg Oral BID   enoxaparin (LOVENOX) injection  40 mg Subcutaneous Q24H   escitalopram  10 mg Oral Daily   feeding supplement  237 mL Oral BID BM   insulin aspart  0-6 Units Subcutaneous TID WC   pantoprazole  40 mg Oral Daily   pregabalin  100 mg Oral TID   senna  1 tablet Oral BID   Vitamin D (Ergocalciferol)  50,000 Units Oral Q7 days   Continuous Infusions:  sodium chloride 100 mL/hr at 03/17/24 1440     LOS: 2 days    Time spent: 51 minutes spent on 03/17/2024 caring for this patient face-to-face including chart review, ordering labs/tests, documenting,  discussion with nursing staff, consultants, updating family and interview/physical exam    Rema Care [place], DO [facility] Available via Epic secure chat 7am-7pm After these hours, please refer to coverage provider listed on [email or web address] 03/17/2024, 5:06 PM

## 2024-03-17 NOTE — TOC Progression Note (Addendum)
 Transition of Care [facility]) - Progression Note    Patient Details  Name: Mike Hart MRN: [id number] Date of Birth: 01/04/1956  [facility]) CM/SW Contact  Elspeth Hals, LCSW Phone Number: 03/17/2024, 11:59 AM  Clinical Narrative:   Bed offers provided to pt, he would like to accept offer at [facility].   [facility] informed, no bed today, should have bed available tomorrow.  Per MD, likely DC tomorrow.  1200: PASSR received: [id number] E  1500: SNF auth request submitted in Navi and approved: [id number], 5 days: 6/5-6/9.  Expected Discharge Plan: Skilled Nursing Facility Barriers to Discharge: Continued Medical Work up, SNF Pending bed offer  Expected Discharge Plan and Services In-house Referral: Clinical Social Work   Post Acute Care Choice: Skilled Nursing Facility Living arrangements for the past 2 months: Single Family Home                                       Social Determinants of Health (SDOH) Interventions SDOH Screenings   Tobacco Use: Medium Risk (03/15/2024)    Readmission Risk Interventions     No data to display

## 2024-03-17 NOTE — Progress Notes (Signed)
 Orthopaedic Trauma Progress Note  SUBJECTIVE: Patient doing fairly well this morning.  Pain controlled. States therapies went okay yesterday, was able to mobilize to bedside chair and stayed there from about lunchtime to midnight.  Is hopeful to be able to ambulate further today.  No chest pain. No SOB. No nausea/vomiting.  No BM since admission, is passing gas.  Denies any abdominal pain.  Notes at baseline typically has BM daily.  No other complaints.    OBJECTIVE:  Vitals:   03/17/24 0300 03/17/24 0816  BP: 121/61 (!) 122/59  Pulse: 67 70  Resp:    Temp: 97.8 F (36.6 C) 98.7 F (37.1 C)  SpO2: 100% 100%    Opiates Today (MME): Today's  total administered Morphine Milligram Equivalents: 3.75 Opiates Yesterday (MME): Yesterday's total administered Morphine Milligram Equivalents: 13.75  General: Laying in bed eating breakfast, no acute distress. Respiratory: No increased work of breathing.  Operative Extremity (left lower extremity): Dressings removed, incisions are clean, dry, intact.  Incisions left open to air.  Some soreness over the lateral hip/thigh as expected.  Compartment soft and compressible.  No significant calf tenderness.  Ankle dorsiflexion/plantarflexion intact.  Able to wiggle the toes.  Endorses sensation light touch over all aspects of the foot. + DP pulse  IMAGING: Stable post op imaging.   LABS:  Results for orders placed or performed during the hospital encounter of 03/15/24 (from the past 24 hours)  Glucose, capillary     Status: Abnormal   Collection Time: 03/16/24 11:03 AM  Result Value Ref Range   Glucose-Capillary 169 (H) 70 - 99 mg/dL  Vitamin B12     Status: Abnormal   Collection Time: 03/16/24 12:42 PM  Result Value Ref Range   Vitamin B-12 54 (L) 180 - 914 pg/mL  Folate     Status: None   Collection Time: 03/16/24 12:42 PM  Result Value Ref Range   Folate 11.6 >5.9 ng/mL  Iron and TIBC     Status: Abnormal   Collection Time: 03/16/24 12:42 PM   Result Value Ref Range   Iron 30 (L) 45 - 182 ug/dL   TIBC 244 010 - 272 ug/dL   Saturation Ratios 10 (L) 17.9 - 39.5 %   UIBC 258 ug/dL  Ferritin     Status: None   Collection Time: 03/16/24 12:42 PM  Result Value Ref Range   Ferritin 74 24 - 336 ng/mL  Reticulocytes     Status: Abnormal   Collection Time: 03/16/24 12:42 PM  Result Value Ref Range   Retic Ct Pct 1.6 0.4 - 3.1 %   RBC. 3.23 (L) 4.22 - 5.81 MIL/uL   Retic Count, Absolute 50.7 19.0 - 186.0 K/uL   Immature Retic Fract 12.2 2.3 - 15.9 %  Glucose, capillary     Status: Abnormal   Collection Time: 03/16/24  4:35 PM  Result Value Ref Range   Glucose-Capillary 183 (H) 70 - 99 mg/dL  Glucose, capillary     Status: Abnormal   Collection Time: 03/16/24  9:28 PM  Result Value Ref Range   Glucose-Capillary 143 (H) 70 - 99 mg/dL  CBC     Status: Abnormal   Collection Time: 03/17/24  5:38 AM  Result Value Ref Range   WBC 4.7 4.0 - 10.5 K/uL   RBC 2.86 (L) 4.22 - 5.81 MIL/uL   Hemoglobin 8.6 (L) 13.0 - 17.0 g/dL   HCT 53.6 (L) 64.4 - 03.4 %   MCV 88.5 80.0 - 100.0  fL   MCH 30.1 26.0 - 34.0 pg   MCHC 34.0 30.0 - 36.0 g/dL   RDW 44.0 10.2 - 72.5 %   Platelets 123 (L) 150 - 400 K/uL   nRBC 0.0 0.0 - 0.2 %  Basic metabolic panel with GFR     Status: Abnormal   Collection Time: 03/17/24  5:38 AM  Result Value Ref Range   Sodium 137 135 - 145 mmol/L   Potassium 4.0 3.5 - 5.1 mmol/L   Chloride 104 98 - 111 mmol/L   CO2 26 22 - 32 mmol/L   Glucose, Bld 170 (H) 70 - 99 mg/dL   BUN 11 8 - 23 mg/dL   Creatinine, Ser 3.66 0.61 - 1.24 mg/dL   Calcium 8.2 (L) 8.9 - 10.3 mg/dL   GFR, Estimated >44 >03 mL/min   Anion gap 7 5 - 15  Glucose, capillary     Status: Abnormal   Collection Time: 03/17/24  6:41 AM  Result Value Ref Range   Glucose-Capillary 169 (H) 70 - 99 mg/dL    ASSESSMENT: Mike Hart is a 68 y.o. male, 2 Days Post-Op s/p mechanical fall Procedures: CEPHALOMEDULLARY NAILING OF LEFT INTERTROCHANTERIC  FEMUR FRACTURE/BASICERVICAL FEMORAL NECK FRACTURE  CV/Blood loss: Acute blood loss anemia, Hgb 8.6 this morning. Hemodynamically stable this morning  PLAN: Weightbearing: WBAT LLE ROM: No ROM restrictions Incisional and dressing care: Okay to leave incisions open to air.  Change mepilex PRN Showering: Okay to begin getting incisions wet starting 03/18/2024 Orthopedic device(s): None  Pain management:  1. Tylenol  325-650 mg q 6 hours  PRN 2. Robaxin 500 mg q 6 hours PRN 3. Norco 5-325 mg q 4 hours PRN 4. Morphine 0.5-1 mg q 2 hours PRN 5. Lyrica 100 mg TID 6. Celebrex 200 mg BID VTE prophylaxis: Lovenox, SCDs ID:  Ancef 2gm post op completed Foley/Lines:  No foley, KVO IVFs Impediments to Fracture Healing: Vitamin D level 17, started on supplementation Dispo: PT/OT evaluation ongoing, recommending SNF at discharge. Patient agreeable. TOC following for placement. Appreciate assistance from hospitalist team with medical management.  Okay for discharge from ortho standpoint once cleared by medicine team and therapies  D/C recommendations: - Norco, Robaxin, Tylenol  for pain control - ASA 81 mg daily x 30 days for DVT prophylaxis - Continue 50,000 units Vit D supplementation q. 7 days x 5 weeks  Follow - up plan: 2 weeks after d/c for wound check and repeat x-rays   Contact information:  Katheryne Pane MD, Alona Jamaica PA-C. After hours and holidays please check [email or web address] for group call information for [facility]   Edilia Gordon, PA-C [phone] (office) [email or web address]

## 2024-03-17 NOTE — Plan of Care (Signed)
  Problem: Fluid Volume: Goal: Ability to maintain a balanced intake and output will improve Outcome: Not Progressing   Problem: Metabolic: Goal: Ability to maintain appropriate glucose levels will improve Outcome: Not Progressing   Problem: Skin Integrity: Goal: Risk for impaired skin integrity will decrease Outcome: Not Progressing   Problem: Tissue Perfusion: Goal: Adequacy of tissue perfusion will improve Outcome: Not Progressing   Problem: Activity: Goal: Risk for activity intolerance will decrease Outcome: Not Progressing   Problem: Elimination: Goal: Will not experience complications related to bowel motility Outcome: Not Progressing   Problem: Pain Managment: Goal: General experience of comfort will improve and/or be controlled Outcome: Not Progressing   Problem: Safety: Goal: Ability to remain free from injury will improve Outcome: Not Progressing

## 2024-03-18 DIAGNOSIS — S72002S Fracture of unspecified part of neck of left femur, sequela: Secondary | ICD-10-CM | POA: Diagnosis not present

## 2024-03-18 DIAGNOSIS — S72002A Fracture of unspecified part of neck of left femur, initial encounter for closed fracture: Secondary | ICD-10-CM | POA: Diagnosis not present

## 2024-03-18 DIAGNOSIS — R2689 Other abnormalities of gait and mobility: Secondary | ICD-10-CM | POA: Diagnosis not present

## 2024-03-18 DIAGNOSIS — E7849 Other hyperlipidemia: Secondary | ICD-10-CM | POA: Diagnosis not present

## 2024-03-18 DIAGNOSIS — R1310 Dysphagia, unspecified: Secondary | ICD-10-CM | POA: Diagnosis not present

## 2024-03-18 DIAGNOSIS — M2550 Pain in unspecified joint: Secondary | ICD-10-CM | POA: Diagnosis not present

## 2024-03-18 DIAGNOSIS — E119 Type 2 diabetes mellitus without complications: Secondary | ICD-10-CM | POA: Diagnosis not present

## 2024-03-18 DIAGNOSIS — M6259 Muscle wasting and atrophy, not elsewhere classified, multiple sites: Secondary | ICD-10-CM | POA: Diagnosis not present

## 2024-03-18 DIAGNOSIS — Z4789 Encounter for other orthopedic aftercare: Secondary | ICD-10-CM | POA: Diagnosis not present

## 2024-03-18 DIAGNOSIS — Z7901 Long term (current) use of anticoagulants: Secondary | ICD-10-CM | POA: Diagnosis not present

## 2024-03-18 DIAGNOSIS — I10 Essential (primary) hypertension: Secondary | ICD-10-CM | POA: Diagnosis not present

## 2024-03-18 DIAGNOSIS — Z96642 Presence of left artificial hip joint: Secondary | ICD-10-CM | POA: Diagnosis not present

## 2024-03-18 DIAGNOSIS — E785 Hyperlipidemia, unspecified: Secondary | ICD-10-CM | POA: Diagnosis not present

## 2024-03-18 DIAGNOSIS — S72002D Fracture of unspecified part of neck of left femur, subsequent encounter for closed fracture with routine healing: Secondary | ICD-10-CM | POA: Diagnosis not present

## 2024-03-18 DIAGNOSIS — R0989 Other specified symptoms and signs involving the circulatory and respiratory systems: Secondary | ICD-10-CM | POA: Diagnosis not present

## 2024-03-18 DIAGNOSIS — R1311 Dysphagia, oral phase: Secondary | ICD-10-CM | POA: Diagnosis not present

## 2024-03-18 DIAGNOSIS — Z741 Need for assistance with personal care: Secondary | ICD-10-CM | POA: Diagnosis not present

## 2024-03-18 DIAGNOSIS — G8918 Other acute postprocedural pain: Secondary | ICD-10-CM | POA: Diagnosis not present

## 2024-03-18 DIAGNOSIS — K219 Gastro-esophageal reflux disease without esophagitis: Secondary | ICD-10-CM | POA: Diagnosis not present

## 2024-03-18 DIAGNOSIS — R2681 Unsteadiness on feet: Secondary | ICD-10-CM | POA: Diagnosis not present

## 2024-03-18 DIAGNOSIS — Z471 Aftercare following joint replacement surgery: Secondary | ICD-10-CM | POA: Diagnosis not present

## 2024-03-18 DIAGNOSIS — F419 Anxiety disorder, unspecified: Secondary | ICD-10-CM | POA: Diagnosis not present

## 2024-03-18 DIAGNOSIS — M6281 Muscle weakness (generalized): Secondary | ICD-10-CM | POA: Diagnosis not present

## 2024-03-18 DIAGNOSIS — Z9181 History of falling: Secondary | ICD-10-CM | POA: Diagnosis not present

## 2024-03-18 LAB — GLUCOSE, CAPILLARY
Glucose-Capillary: 142 mg/dL — ABNORMAL HIGH (ref 70–99)
Glucose-Capillary: 174 mg/dL — ABNORMAL HIGH (ref 70–99)
Glucose-Capillary: 191 mg/dL — ABNORMAL HIGH (ref 70–99)

## 2024-03-18 MED ORDER — OXYCODONE HCL 5 MG PO TABS: 5.0000 mg | ORAL_TABLET | ORAL | 0 refills | Status: AC | PRN

## 2024-03-18 MED ORDER — ALPRAZOLAM 0.5 MG PO TABS: ORAL_TABLET | ORAL | 0 refills | Status: DC

## 2024-03-18 MED ORDER — DOCUSATE SODIUM 100 MG PO CAPS: 100.0000 mg | ORAL_CAPSULE | Freq: Two times a day (BID) | ORAL | Status: AC

## 2024-03-18 MED ORDER — CALCIUM CARBONATE 1250 (500 CA) MG PO TABS: 1.0000 | ORAL_TABLET | Freq: Two times a day (BID) | ORAL | Status: AC

## 2024-03-18 MED ORDER — VITAMIN B-12 1000 MCG PO TABS: 1000.0000 ug | ORAL_TABLET | Freq: Every day | ORAL | 0 refills | Status: AC

## 2024-03-18 NOTE — Care Management Important Message (Signed)
 Important Message  Patient Details  Name: Mike Hart MRN: [id number] Date of Birth: [birth year]   Important Message Given:  Yes - Medicare IM     Felix Host 03/18/2024, 11:44 AM

## 2024-03-18 NOTE — TOC Progression Note (Signed)
 Transition of Care [facility]) - Progression Note    Patient Details  Name: Mike Hart MRN: [id number] Date of Birth: 12-22-1955  [facility] CM/SW Contact  Elspeth Hals, LCSW Phone Number: 03/18/2024, 3:09 PM  Clinical Narrative:   CSW confirmed with [facility] that they can receive pt today.  The bed will be available at 3pm.  1450: Pt does not have transportation options.  [facility] will transport.  Waiver signed and placed on chart.     Expected Discharge Plan: Skilled Nursing Facility Barriers to Discharge: Barriers Resolved  Expected Discharge Plan and Services In-house Referral: Clinical Social Work   Post Acute Care Choice: Skilled Nursing Facility Living arrangements for the past 2 months: Single Family Home Expected Discharge Date: 03/18/24                                     Social Determinants of Health (SDOH) Interventions SDOH Screenings   Food Insecurity: No Food Insecurity (03/18/2024)  Housing: Low Risk  (03/18/2024)  Transportation Needs: No Transportation Needs (03/18/2024)  Utilities: Not At Risk (03/18/2024)  Social Connections: Unknown (03/18/2024)  Tobacco Use: Medium Risk (03/15/2024)    Readmission Risk Interventions     No data to display

## 2024-03-18 NOTE — Plan of Care (Signed)
  Problem: Nutritional: Goal: Maintenance of adequate nutrition will improve Outcome: Progressing   Problem: Skin Integrity: Goal: Risk for impaired skin integrity will decrease Outcome: Progressing   Problem: Education: Goal: Knowledge of General Education information will improve Description: Including pain rating scale, medication(s)/side effects and non-pharmacologic comfort measures Outcome: Progressing   

## 2024-03-18 NOTE — TOC Transition Note (Signed)
 Transition of Care [facility]) - Discharge Note   Patient Details  Name: Mike Hart MRN: [id number] Date of Birth: [birth year]  [facility]) CM/SW Contact:  Elspeth Hals, LCSW Phone Number: 03/18/2024, 3:08 PM   Clinical Narrative:   Pt discharging to [place] [place], room 111.  RN call report to [phone].  Pt will transport via [facility].  They will call RN station when they are at the main north tower entrance and pt will need to be brought down to them.    Final next level of care: Skilled Nursing Facility Barriers to Discharge: Barriers Resolved   Patient Goals and CMS Choice Patient states their goals for this hospitalization and ongoing recovery are:: return to work [email or web address].gov Compare Post Acute Care list provided to:: Patient Choice offered to / list presented to : Patient      Discharge Placement              Patient chooses bed at: [facility] Patient to be transferred to facility by: safe transport Name of family member notified: none per pt    Discharge Plan and Services Additional resources added to the After Visit Summary for   In-house Referral: Clinical Social Work   Post Acute Care Choice: Skilled Nursing Facility                               Social Drivers of Health (SDOH) Interventions SDOH Screenings   Food Insecurity: No Food Insecurity (03/18/2024)  Housing: Low Risk  (03/18/2024)  Transportation Needs: No Transportation Needs (03/18/2024)  Utilities: Not At Risk (03/18/2024)  Social Connections: Unknown (03/18/2024)  Tobacco Use: Medium Risk (03/15/2024)     Readmission Risk Interventions     No data to display

## 2024-03-18 NOTE — Progress Notes (Signed)
 Occupational Therapy Treatment Patient Details Name: Mike Hart MRN: [id number] DOB: 12/27/1955 Today's Date: 03/18/2024   History of present illness Pt is a 68 y.o. male presenting to [facility] ED after a mechanical fall with left hip fx on 03/15/24. Pt is s/p nailing of left intertrochanteric femur fx on 03/15/24. PMH is significant for HTN, HLD, DM, chronic pain, and aortic atherosclerosis.   OT comments  Pt progressing well towards goals. Pt received on [facility], reporting he transferred himself there. OT educated pt on importance of using his call bell d/t high fall risk. Pt progressed to complete standing grooming tasks at sink with CGA for balance. Continue to be limited by decreased strength, balance, and activity tolerance. Continue to recommend <3 hours of skilled rehab daily to optimize independence levels. Will continue to follow acutely.      If plan is discharge home, recommend the following:  A little help with walking and/or transfers;A lot of help with bathing/dressing/bathroom;Assistance with cooking/housework;Assist for transportation;Help with stairs or ramp for entrance   Equipment Recommendations  BSC/3in1    Recommendations for Other Services      Precautions / Restrictions Precautions Precautions: Fall Recall of Precautions/Restrictions: Impaired Restrictions Weight Bearing Restrictions Per Provider Order: Yes LLE Weight Bearing Per Provider Order: Weight bearing as tolerated       Mobility Bed Mobility       General bed mobility comments: Received on BSC    Transfers Overall transfer level: Needs assistance Equipment used: Rolling walker (2 wheels) Transfers: Sit to/from Stand, Bed to chair/wheelchair/BSC Sit to Stand: Min assist     Step pivot transfers: Supervision     General transfer comment: Min assist to come to stand, once standing, CGA for balance.     Balance Overall balance assessment: Needs assistance Sitting-balance support: Bilateral  upper extremity supported, Feet supported Sitting balance-Leahy Scale: Fair     Standing balance support: Bilateral upper extremity supported, Reliant on assistive device for balance Standing balance-Leahy Scale: Poor Standing balance comment: Reliant on RW       ADL either performed or assessed with clinical judgement   ADL Overall ADL's : Needs assistance/impaired     Grooming: Standing;Wash/dry face;Wash/dry hands;Contact guard assist Grooming Details (indicate cue type and reason): no LOB without UE support     Toilet Transfer: Supervision/safety;Rolling walker (2 wheels) Toilet Transfer Details (indicate cue type and reason): S for safety with RW Toileting- Clothing Manipulation and Hygiene: Contact guard assist;Sit to/from stand       Functional mobility during ADLs: Supervision/safety;Rolling walker (2 wheels)      Extremity/Trunk Assessment Upper Extremity Assessment Upper Extremity Assessment: Overall WFL for tasks assessed   Lower Extremity Assessment Lower Extremity Assessment: Defer to PT evaluation        Vision   Vision Assessment?: No apparent visual deficits         Communication Communication Communication: No apparent difficulties   Cognition Arousal: Alert Behavior During Therapy: WFL for tasks assessed/performed Cognition: No apparent impairments             OT - Cognition Comments: poor safety awareness snd judgement                 Following commands: Intact        Cueing   Cueing Techniques: Verbal cues        General Comments Pt received on BSC reporting he transferred himself with no staff present,    Pertinent Vitals/ Pain  Pain Assessment Pain Assessment: Faces Faces Pain Scale: Hurts a little bit Pain Location: L hip Pain Descriptors / Indicators: Grimacing Pain Intervention(s): Monitored during session   Frequency  Min 2X/week        Progress Toward Goals  OT Goals(current goals can now be  found in the care plan section)  Progress towards OT goals: Progressing toward goals  Acute Rehab OT Goals Patient Stated Goal: To go home OT Goal Formulation: With patient Time For Goal Achievement: 03/30/24 Potential to Achieve Goals: Good ADL Goals Pt Will Perform Grooming: with modified independence;standing Pt Will Perform Lower Body Dressing: with modified independence;sit to/from stand Pt Will Transfer to Toilet: with modified independence;ambulating Pt Will Perform Toileting - Clothing Manipulation and hygiene: with modified independence;sit to/from stand  Plan         AM-PAC OT "6 Clicks" Daily Activity     Outcome Measure   Help from another person eating meals?: None Help from another person taking care of personal grooming?: A Little Help from another person toileting, which includes using toliet, bedpan, or urinal?: A Little Help from another person bathing (including washing, rinsing, drying)?: A Little Help from another person to put on and taking off regular upper body clothing?: A Little Help from another person to put on and taking off regular lower body clothing?: A Little 6 Click Score: 19    End of Session Equipment Utilized During Treatment: Gait belt;Rolling walker (2 wheels)  OT Visit Diagnosis: Other abnormalities of gait and mobility (R26.89);Muscle weakness (generalized) (M62.81);Pain Pain - Right/Left: Left Pain - part of body: Hip;Leg   Activity Tolerance Patient tolerated treatment well   Patient Left in chair;with call bell/phone within reach;with chair alarm set   Nurse Communication Mobility status        Time: 0981-1914 OT Time Calculation (min): 25 min  Charges: OT General Charges $OT Visit: 1 Visit OT Treatments $Self Care/Home Management : 23-37 mins  Delmer Ferraris, OT  Acute Rehabilitation Services Office [phone] Secure chat preferred   Mickael Alamo 03/18/2024, 11:08 AM

## 2024-03-18 NOTE — Discharge Summary (Signed)
 Physician Discharge Summary  Mike Hart:[id number] DOB: [birth year] DOA: 03/15/2024  PCP: Lanae Pinal, MD  Admit date: 03/15/2024 Discharge date: 03/18/2024  Admitted From: Home Disposition: [facility]  Recommendations for Outpatient Follow-up:  Follow up with PCP in 1-2 weeks Follow-up with orthopedics, Dr. Curtiss Dowdy 2 weeks Discontinued amlodipine, lisinopril-HCTZ due to borderline low blood pressures, continue to monitor BP before consideration of restarting  Discharge Condition: Stable CODE STATUS: Full code Diet recommendation: Heart healthy/consistent carbohydrate diet  History of present illness:  Mike Hart is a 68 y.o. male with past medical history significant for HTN, HLD, DM2, chronic pain, anxiety, seasonal allergies who presented to [facility] ED on 03/15/2024 with left hip pain following mechanical fall.  Workup in the ED notable for left intertrochanteric femur fracture.  Orthopedics was consulted and patient underwent cephalomedullary nailing by orthopedics, Dr. Curtiss Dowdy on 03/15/2024.   Hospital course:  Left intertrochanteric femur fracture Left basicervical femoral neck fracture  Patient presenting to the ED from home via EMS after sustaining a mechanical fall while gardening.  Complaining of left hip pain, unable to ambulate.  Imaging notable for acute moderately displaced left femur fracture..  Orthopedics was consulted and patient underwent cephalic medullary nailing of left intertrochanteric femur fracture by Dr. Curtiss Dowdy on 03/15/2024. WBAT LLE, pain troll with oxycodone , Tylenol , Robaxin as needed.  Seen by PT/OT with recommendation of SNF placement.  Outpatient follow-up with orthopedics 2 weeks for repeat x-rays and wound check with Dr. Curtiss Dowdy.   Acute postoperative blood loss anemia, expected Hemoglobin 8.6 at time of discharge.  Recommend repeat CBC 1 week.   Vitamin D deficiency Vitamin D 25-hydroxy level low at 17. Vitamin D 50,000 units p.o. weekly  x 5 weeks   Hypocalcemia Calcium 8.8 on admission. Calcium carbonate 1250 mg p.o. twice daily   HTN On amlodipine, lisinopril-HCTZ, holding due to borderline hypotension in the perioperative setting.  Continue to monitor BP closely before consideration of restarting.   HLD Atorvastatin 20 g p.o. daily   DM2 On metformin 1000 mg p.o. twice daily at home.  Hemoglobin C 6.7, well-controlled.   Anxiety/depression: Alprazolam  PRN anxiety, Lexapro    Hyponatremia: Resolved Improved with IV fluid hydration.  Sodium improved from 129-137 time of discharge.   GERD Protonix 40 mg p.o. daily  Discharge Diagnoses:  Principal Problem:   Hip fracture [facility])    Discharge Instructions  Discharge Instructions     Call MD for:  difficulty breathing, headache or visual disturbances   Complete by: As directed    Call MD for:  extreme fatigue   Complete by: As directed    Call MD for:  persistant dizziness or light-headedness   Complete by: As directed    Call MD for:  persistant nausea and vomiting   Complete by: As directed    Call MD for:  severe uncontrolled pain   Complete by: As directed    Call MD for:  temperature >100.4   Complete by: As directed    Diet - low sodium heart healthy   Complete by: As directed    Increase activity slowly   Complete by: As directed       Allergies as of 03/18/2024   No Known Allergies      Medication List     STOP taking these medications    amLODipine 5 MG tablet Commonly known as: NORVASC   ibuprofen  800 MG tablet Commonly known as: ADVIL    lisinopril-hydrochlorothiazide 20-25 MG tablet Commonly known as:  ZESTORETIC       TAKE these medications    acetaminophen  500 MG tablet Commonly known as: TYLENOL  Take 1,000 mg by mouth every 6 (six) hours as needed for mild pain (pain score 1-3) or moderate pain (pain score 4-6).   albuterol  108 (90 Base) MCG/ACT inhaler Commonly known as: VENTOLIN  HFA Inhale 2 puffs into the  lungs every 4 (four) hours as needed for wheezing or shortness of breath.   ALPRAZolam  0.5 MG tablet Commonly known as: XANAX  TAKE 1 TABLET(0.5 MG) BY MOUTH TWICE DAILY AS NEEDED FOR ANXIETY   aspirin EC 81 MG tablet Take 1 tablet (81 mg total) by mouth daily. Swallow whole.   atorvastatin 20 MG tablet Commonly known as: LIPITOR Take 20 mg by mouth in the morning.   baclofen 10 MG tablet Commonly known as: LIORESAL Take 10 mg by mouth at bedtime.   calcium carbonate 1250 (500 Ca) MG tablet Commonly known as: OS-CAL - dosed in mg of elemental calcium Take 1 tablet (1,250 mg total) by mouth 2 (two) times daily with a meal.   calcium carbonate 500 MG chewable tablet Commonly known as: TUMS - dosed in mg elemental calcium Chew 2 tablets by mouth as needed for indigestion or heartburn.   docusate sodium 100 MG capsule Commonly known as: COLACE Take 1 capsule (100 mg total) by mouth 2 (two) times daily.   escitalopram 10 MG tablet Commonly known as: LEXAPRO Take 10 mg by mouth 2 (two) times daily as needed.   fluticasone  50 MCG/ACT nasal spray Commonly known as: FLONASE Place 1 spray into both nostrils as needed for allergies. For seasonal allergies   metFORMIN 1000 MG tablet Commonly known as: GLUCOPHAGE Take 500-1,000 mg by mouth See admin instructions. Take 0.5 tablet (500 mg) by mouth every morning and 1 (1000 mg) every evening   methocarbamol 500 MG tablet Commonly known as: ROBAXIN Take 1 tablet (500 mg total) by mouth every 8 (eight) hours as needed for muscle spasms. What changed:  how much to take when to take this   oxyCODONE  5 MG immediate release tablet Commonly known as: Roxicodone  Take 1 tablet (5 mg total) by mouth every 4 (four) hours as needed for moderate pain (pain score 4-6).   pantoprazole 40 MG tablet Commonly known as: PROTONIX Take 40 mg by mouth as needed.   Vitamin D (Ergocalciferol) 1.25 MG (50000 UNIT) Caps capsule Commonly known as:  DRISDOL Take 1 capsule (50,000 Units total) by mouth every 7 (seven) days. Start taking on: March 23, 2024        Follow-up Information     Haddix, Florentina Huntsman, MD. Schedule an appointment as soon as possible for a visit in 2 week(s).   Specialty: Orthopedic Surgery Why: for wound check and repeat x-rays Contact information: [address] [place] [address] [phone]         Lanae Pinal, MD. Schedule an appointment as soon as possible for a visit in 1 week(s).   Specialty: Family Medicine Contact information: [address] [place] [address] [phone]                No Known Allergies  Consultations: Orthopedics, Dr. Curtiss Dowdy   Procedures/Studies: DG HIP PORT UNILAT W OR W/O PELVIS 1V LEFT Result Date: 03/15/2024 CLINICAL DATA:  96051 Fracture 96051 EXAM: DG HIP (WITH OR WITHOUT PELVIS) 1V PORT LEFT COMPARISON:  Preoperative imaging FINDINGS: Femoral intramedullary nail with trans trochanteric and distal locking screw  fixation traverses comminuted proximal femur fracture. Improved fracture alignment from preoperative imaging. Recent postsurgical change includes air and edema in the soft tissues. IMPRESSION: ORIF of comminuted proximal femur fracture. No immediate postoperative complication. Electronically Signed   By: Chadwick Colonel M.D.   On: 03/15/2024 19:40   DG FEMUR MIN 2 VIEWS LEFT Result Date: 03/15/2024 CLINICAL DATA:  Elective surgery. EXAM: LEFT FEMUR 2 VIEWS COMPARISON:  Preoperative imaging FINDINGS: Five fluoroscopic spot views of the left proximal femur submitted from the operating room. Femoral intramedullary nail with trans trochanteric and distal locking screw fixation traverse proximal femur fracture. Fluoroscopy time 2 minutes 59 seconds. Dose 30.25 mGy. IMPRESSION: Intraoperative fluoroscopy during proximal femur fracture fixation. Electronically Signed   By: Chadwick Colonel M.D.   On: 03/15/2024 15:16   DG C-Arm 1-60  Min-No Report Result Date: 03/15/2024 Fluoroscopy was utilized by the requesting physician.  No radiographic interpretation.   DG Chest 1 View Result Date: 03/15/2024 CLINICAL DATA:  Status post fall.  Left femur fracture. EXAM: CHEST  1 VIEW COMPARISON:  Radiographs 12/23/2020.  CT 12/23/2020. FINDINGS: 1022 hours. Two views obtained. The heart size and mediastinal contours are normal. The lungs are clear. There is no pleural effusion or pneumothorax. No acute osseous findings are identified. Telemetry leads overlie the chest. IMPRESSION: No evidence of acute cardiopulmonary process. Electronically Signed   By: Elmon Hagedorn M.D.   On: 03/15/2024 12:00   DG Hip Unilat W or Wo Pelvis 2-3 Views Left Result Date: 03/15/2024 CLINICAL DATA:  Hip pain and deformity after falling yesterday. Inability to ambulate. EXAM: DG HIP (WITH OR WITHOUT PELVIS) 2-3V LEFT COMPARISON:  None Available. FINDINGS: The bones appear adequately mineralized. There is an acute, moderately displaced intertrochanteric left femur fracture with up to 2.7 cm of proximal displacement. No evidence of dislocation or pelvic fracture. The hip and sacroiliac joint spaces appear preserved. IMPRESSION: Acute, moderately displaced intertrochanteric left femur fracture. Electronically Signed   By: Elmon Hagedorn M.D.   On: 03/15/2024 11:58     Subjective: Patient seen examined bedside, lying in bed.  Pain controlled.  Discharging to SNF.  No other complaints, concerns or questions at this time.  Denies headache, no dizziness, no chest pain, no palpitations, no shortness of breath, no abdominal pain, no fever/chills/night sweats, no nausea/vomiting/diarrhea, no focal weakness, no fatigue, no paresthesia.  No acute events overnight per nursing staff.  Discharge Exam: Vitals:   03/18/24 0516 03/18/24 0821  BP: 132/67 126/61  Pulse: 66 76  Resp: 18 19  Temp: 98.2 F (36.8 C) 99.2 F (37.3 C)  SpO2: 100% 100%   Vitals:   03/17/24 1609  03/17/24 2111 03/18/24 0516 03/18/24 0821  BP: (!) 142/64 132/71 132/67 126/61  Pulse: 80 78 66 76  Resp:  18 18 19   Temp: 98.8 F (37.1 C) 98.6 F (37 C) 98.2 F (36.8 C) 99.2 F (37.3 C)  TempSrc:  Oral Oral Oral  SpO2: 100% 100% 100% 100%  Weight:      Height:        Physical Exam: GEN: NAD, alert and oriented x 3, wd/wn HEENT: NCAT, PERRL, EOMI, sclera clear, MMM PULM: CTAB w/o wheezes/crackles, normal respiratory effort, on room air CV: RRR w/o M/G/R GI: abd soft, NTND, + BS MSK: no peripheral edema, moves all extremities dependently, noted surgical incision sites, no surrounding erythema/fluctuance or ecchymosis NEURO: No focal neurological deficit PSYCH: normal mood/affect Integumentary: Surgical incision site LLE as above, otherwise no other concerning rashes/wounds/wounds  noted on exposed skin surfaces    The results of significant diagnostics from this hospitalization (including imaging, microbiology, ancillary and laboratory) are listed below for reference.     Microbiology: Recent Results (from the past 240 hours)  Surgical pcr screen     Status: None   Collection Time: 03/15/24 12:14 PM   Specimen: Nasal Mucosa; Nasal Swab  Result Value Ref Range Status   MRSA, PCR NEGATIVE NEGATIVE Final   Staphylococcus aureus NEGATIVE NEGATIVE Final    Comment: (NOTE) The Xpert SA Assay (FDA approved for NASAL specimens in patients 33 years of age and older), is one component of a comprehensive surveillance program. It is not intended to diagnose infection nor to guide or monitor treatment. Performed at [facility] [place] [facility], [address] [place], [place] [address]      Labs: BNP (last 3 results) No results for input(s): "BNP" in the last 8760 hours. Basic Metabolic Panel: Recent Labs  Lab 03/15/24 1005 03/16/24 0612 03/17/24 0538  NA 129* 132* 137  K 3.4* 3.4* 4.0  CL 94* 100 104  CO2 25 26 26   GLUCOSE 158* 137* 170*  BUN 6* 7* 11  CREATININE 0.81 1.06  0.82  CALCIUM 8.8* 7.5* 8.2*   Liver Function Tests: Recent Labs  Lab 03/16/24 0612  AST 33  ALT 12  ALKPHOS 28*  BILITOT 0.9  PROT 5.0*  ALBUMIN 2.7*   No results for input(s): "LIPASE", "AMYLASE" in the last 168 hours. No results for input(s): "AMMONIA" in the last 168 hours. CBC: Recent Labs  Lab 03/15/24 1100 03/16/24 0612 03/17/24 0538  WBC 5.5 4.0 4.7  NEUTROABS 4.0  --   --   HGB 12.3* 9.3* 8.6*  HCT 35.8* 27.1* 25.3*  MCV 86.7 87.1 88.5  PLT 203 149* 123*   Cardiac Enzymes: No results for input(s): "CKTOTAL", "CKMB", "CKMBINDEX", "TROPONINI" in the last 168 hours. BNP: Invalid input(s): "POCBNP" CBG: Recent Labs  Lab 03/17/24 1112 03/17/24 1606 03/17/24 2114 03/18/24 0618 03/18/24 0827  GLUCAP 210* 166* 190* 142* 174*   D-Dimer No results for input(s): "DDIMER" in the last 72 hours. Hgb A1c Recent Labs    03/15/24 1811  HGBA1C 6.7*   Lipid Profile No results for input(s): "CHOL", "HDL", "LDLCALC", "TRIG", "CHOLHDL", "LDLDIRECT" in the last 72 hours. Thyroid  function studies No results for input(s): "TSH", "T4TOTAL", "T3FREE", "THYROIDAB" in the last 72 hours.  Invalid input(s): "FREET3" Anemia work up Recent Labs    03/16/24 1242  VITAMINB12 54*  FOLATE 11.6  FERRITIN 74  TIBC 288  IRON 30*  RETICCTPCT 1.6   Urinalysis No results found for: "COLORURINE", "APPEARANCEUR", "LABSPEC", "PHURINE", "GLUCOSEU", "HGBUR", "BILIRUBINUR", "KETONESUR", "PROTEINUR", "UROBILINOGEN", "NITRITE", "LEUKOCYTESUR" Sepsis Labs Recent Labs  Lab 03/15/24 1100 03/16/24 0612 03/17/24 0538  WBC 5.5 4.0 4.7   Microbiology Recent Results (from the past 240 hours)  Surgical pcr screen     Status: None   Collection Time: 03/15/24 12:14 PM   Specimen: Nasal Mucosa; Nasal Swab  Result Value Ref Range Status   MRSA, PCR NEGATIVE NEGATIVE Final   Staphylococcus aureus NEGATIVE NEGATIVE Final    Comment: (NOTE) The Xpert SA Assay (FDA approved for NASAL  specimens in patients 60 years of age and older), is one component of a comprehensive surveillance program. It is not intended to diagnose infection nor to guide or monitor treatment. Performed at [facility], [address], [place], [place] [address]      Time coordinating discharge:  Over 30 minutes  SIGNED:   Rema Care [place], DO  [facility] 03/18/2024, 10:21 AM

## 2024-03-19 DIAGNOSIS — R2681 Unsteadiness on feet: Secondary | ICD-10-CM | POA: Diagnosis not present

## 2024-03-19 DIAGNOSIS — Z96642 Presence of left artificial hip joint: Secondary | ICD-10-CM | POA: Diagnosis not present

## 2024-03-19 DIAGNOSIS — Z471 Aftercare following joint replacement surgery: Secondary | ICD-10-CM | POA: Diagnosis not present

## 2024-03-19 DIAGNOSIS — I10 Essential (primary) hypertension: Secondary | ICD-10-CM | POA: Diagnosis not present

## 2024-03-19 DIAGNOSIS — E119 Type 2 diabetes mellitus without complications: Secondary | ICD-10-CM | POA: Diagnosis not present

## 2024-03-19 DIAGNOSIS — Z7901 Long term (current) use of anticoagulants: Secondary | ICD-10-CM | POA: Diagnosis not present

## 2024-03-19 DIAGNOSIS — R1310 Dysphagia, unspecified: Secondary | ICD-10-CM | POA: Diagnosis not present

## 2024-03-22 DIAGNOSIS — G8918 Other acute postprocedural pain: Secondary | ICD-10-CM | POA: Diagnosis not present

## 2024-03-22 DIAGNOSIS — Z471 Aftercare following joint replacement surgery: Secondary | ICD-10-CM | POA: Diagnosis not present

## 2024-03-22 DIAGNOSIS — Z96642 Presence of left artificial hip joint: Secondary | ICD-10-CM | POA: Diagnosis not present

## 2024-03-24 DIAGNOSIS — Z96642 Presence of left artificial hip joint: Secondary | ICD-10-CM | POA: Diagnosis not present

## 2024-03-24 DIAGNOSIS — M2550 Pain in unspecified joint: Secondary | ICD-10-CM | POA: Diagnosis not present

## 2024-03-24 DIAGNOSIS — E7849 Other hyperlipidemia: Secondary | ICD-10-CM | POA: Diagnosis not present

## 2024-03-24 DIAGNOSIS — R2689 Other abnormalities of gait and mobility: Secondary | ICD-10-CM | POA: Diagnosis not present

## 2024-03-24 DIAGNOSIS — F419 Anxiety disorder, unspecified: Secondary | ICD-10-CM | POA: Diagnosis not present

## 2024-03-24 DIAGNOSIS — S72002D Fracture of unspecified part of neck of left femur, subsequent encounter for closed fracture with routine healing: Secondary | ICD-10-CM | POA: Diagnosis not present

## 2024-03-24 DIAGNOSIS — Z471 Aftercare following joint replacement surgery: Secondary | ICD-10-CM | POA: Diagnosis not present

## 2024-03-24 DIAGNOSIS — E119 Type 2 diabetes mellitus without complications: Secondary | ICD-10-CM | POA: Diagnosis not present

## 2024-03-24 DIAGNOSIS — Z9181 History of falling: Secondary | ICD-10-CM | POA: Diagnosis not present

## 2024-03-24 DIAGNOSIS — M6281 Muscle weakness (generalized): Secondary | ICD-10-CM | POA: Diagnosis not present

## 2024-03-24 DIAGNOSIS — Z4789 Encounter for other orthopedic aftercare: Secondary | ICD-10-CM | POA: Diagnosis not present

## 2024-03-28 DIAGNOSIS — G8929 Other chronic pain: Secondary | ICD-10-CM | POA: Diagnosis not present

## 2024-03-28 DIAGNOSIS — F0393 Unspecified dementia, unspecified severity, with mood disturbance: Secondary | ICD-10-CM | POA: Diagnosis not present

## 2024-03-28 DIAGNOSIS — I10 Essential (primary) hypertension: Secondary | ICD-10-CM | POA: Diagnosis not present

## 2024-03-28 DIAGNOSIS — E119 Type 2 diabetes mellitus without complications: Secondary | ICD-10-CM | POA: Diagnosis not present

## 2024-03-28 DIAGNOSIS — F32A Depression, unspecified: Secondary | ICD-10-CM | POA: Diagnosis not present

## 2024-03-28 DIAGNOSIS — E559 Vitamin D deficiency, unspecified: Secondary | ICD-10-CM | POA: Diagnosis not present

## 2024-03-28 DIAGNOSIS — S72142D Displaced intertrochanteric fracture of left femur, subsequent encounter for closed fracture with routine healing: Secondary | ICD-10-CM | POA: Diagnosis not present

## 2024-03-28 DIAGNOSIS — E785 Hyperlipidemia, unspecified: Secondary | ICD-10-CM | POA: Diagnosis not present

## 2024-03-28 DIAGNOSIS — F0394 Unspecified dementia, unspecified severity, with anxiety: Secondary | ICD-10-CM | POA: Diagnosis not present

## 2024-03-30 DIAGNOSIS — S72142D Displaced intertrochanteric fracture of left femur, subsequent encounter for closed fracture with routine healing: Secondary | ICD-10-CM | POA: Diagnosis not present

## 2024-03-31 DIAGNOSIS — I10 Essential (primary) hypertension: Secondary | ICD-10-CM | POA: Diagnosis not present

## 2024-03-31 DIAGNOSIS — E1165 Type 2 diabetes mellitus with hyperglycemia: Secondary | ICD-10-CM | POA: Diagnosis not present

## 2024-03-31 DIAGNOSIS — E559 Vitamin D deficiency, unspecified: Secondary | ICD-10-CM | POA: Diagnosis not present

## 2024-03-31 DIAGNOSIS — Z09 Encounter for follow-up examination after completed treatment for conditions other than malignant neoplasm: Secondary | ICD-10-CM | POA: Diagnosis not present

## 2024-03-31 DIAGNOSIS — Z79899 Other long term (current) drug therapy: Secondary | ICD-10-CM | POA: Diagnosis not present

## 2024-03-31 DIAGNOSIS — Z8781 Personal history of (healed) traumatic fracture: Secondary | ICD-10-CM | POA: Diagnosis not present

## 2024-04-01 DIAGNOSIS — F32A Depression, unspecified: Secondary | ICD-10-CM | POA: Diagnosis not present

## 2024-04-01 DIAGNOSIS — F0393 Unspecified dementia, unspecified severity, with mood disturbance: Secondary | ICD-10-CM | POA: Diagnosis not present

## 2024-04-01 DIAGNOSIS — E785 Hyperlipidemia, unspecified: Secondary | ICD-10-CM | POA: Diagnosis not present

## 2024-04-01 DIAGNOSIS — E559 Vitamin D deficiency, unspecified: Secondary | ICD-10-CM | POA: Diagnosis not present

## 2024-04-01 DIAGNOSIS — S72142D Displaced intertrochanteric fracture of left femur, subsequent encounter for closed fracture with routine healing: Secondary | ICD-10-CM | POA: Diagnosis not present

## 2024-04-01 DIAGNOSIS — E119 Type 2 diabetes mellitus without complications: Secondary | ICD-10-CM | POA: Diagnosis not present

## 2024-04-01 DIAGNOSIS — I10 Essential (primary) hypertension: Secondary | ICD-10-CM | POA: Diagnosis not present

## 2024-04-01 DIAGNOSIS — F0394 Unspecified dementia, unspecified severity, with anxiety: Secondary | ICD-10-CM | POA: Diagnosis not present

## 2024-04-01 DIAGNOSIS — G8929 Other chronic pain: Secondary | ICD-10-CM | POA: Diagnosis not present

## 2024-04-05 DIAGNOSIS — S72142D Displaced intertrochanteric fracture of left femur, subsequent encounter for closed fracture with routine healing: Secondary | ICD-10-CM | POA: Diagnosis not present

## 2024-04-05 DIAGNOSIS — F0394 Unspecified dementia, unspecified severity, with anxiety: Secondary | ICD-10-CM | POA: Diagnosis not present

## 2024-04-05 DIAGNOSIS — I10 Essential (primary) hypertension: Secondary | ICD-10-CM | POA: Diagnosis not present

## 2024-04-05 DIAGNOSIS — E559 Vitamin D deficiency, unspecified: Secondary | ICD-10-CM | POA: Diagnosis not present

## 2024-04-05 DIAGNOSIS — F32A Depression, unspecified: Secondary | ICD-10-CM | POA: Diagnosis not present

## 2024-04-05 DIAGNOSIS — E119 Type 2 diabetes mellitus without complications: Secondary | ICD-10-CM | POA: Diagnosis not present

## 2024-04-05 DIAGNOSIS — F0393 Unspecified dementia, unspecified severity, with mood disturbance: Secondary | ICD-10-CM | POA: Diagnosis not present

## 2024-04-05 DIAGNOSIS — G8929 Other chronic pain: Secondary | ICD-10-CM | POA: Diagnosis not present

## 2024-04-05 DIAGNOSIS — E785 Hyperlipidemia, unspecified: Secondary | ICD-10-CM | POA: Diagnosis not present

## 2024-04-07 DIAGNOSIS — E559 Vitamin D deficiency, unspecified: Secondary | ICD-10-CM | POA: Diagnosis not present

## 2024-04-07 DIAGNOSIS — S72142D Displaced intertrochanteric fracture of left femur, subsequent encounter for closed fracture with routine healing: Secondary | ICD-10-CM | POA: Diagnosis not present

## 2024-04-07 DIAGNOSIS — I10 Essential (primary) hypertension: Secondary | ICD-10-CM | POA: Diagnosis not present

## 2024-04-07 DIAGNOSIS — E785 Hyperlipidemia, unspecified: Secondary | ICD-10-CM | POA: Diagnosis not present

## 2024-04-07 DIAGNOSIS — G8929 Other chronic pain: Secondary | ICD-10-CM | POA: Diagnosis not present

## 2024-04-07 DIAGNOSIS — F0394 Unspecified dementia, unspecified severity, with anxiety: Secondary | ICD-10-CM | POA: Diagnosis not present

## 2024-04-07 DIAGNOSIS — F32A Depression, unspecified: Secondary | ICD-10-CM | POA: Diagnosis not present

## 2024-04-07 DIAGNOSIS — F0393 Unspecified dementia, unspecified severity, with mood disturbance: Secondary | ICD-10-CM | POA: Diagnosis not present

## 2024-04-07 DIAGNOSIS — E119 Type 2 diabetes mellitus without complications: Secondary | ICD-10-CM | POA: Diagnosis not present

## 2024-04-12 DIAGNOSIS — F32A Depression, unspecified: Secondary | ICD-10-CM | POA: Diagnosis not present

## 2024-04-12 DIAGNOSIS — E119 Type 2 diabetes mellitus without complications: Secondary | ICD-10-CM | POA: Diagnosis not present

## 2024-04-12 DIAGNOSIS — I10 Essential (primary) hypertension: Secondary | ICD-10-CM | POA: Diagnosis not present

## 2024-04-12 DIAGNOSIS — E559 Vitamin D deficiency, unspecified: Secondary | ICD-10-CM | POA: Diagnosis not present

## 2024-04-12 DIAGNOSIS — F0393 Unspecified dementia, unspecified severity, with mood disturbance: Secondary | ICD-10-CM | POA: Diagnosis not present

## 2024-04-12 DIAGNOSIS — F0394 Unspecified dementia, unspecified severity, with anxiety: Secondary | ICD-10-CM | POA: Diagnosis not present

## 2024-04-12 DIAGNOSIS — S72142D Displaced intertrochanteric fracture of left femur, subsequent encounter for closed fracture with routine healing: Secondary | ICD-10-CM | POA: Diagnosis not present

## 2024-04-12 DIAGNOSIS — G8929 Other chronic pain: Secondary | ICD-10-CM | POA: Diagnosis not present

## 2024-04-12 DIAGNOSIS — E785 Hyperlipidemia, unspecified: Secondary | ICD-10-CM | POA: Diagnosis not present

## 2024-04-14 DIAGNOSIS — E559 Vitamin D deficiency, unspecified: Secondary | ICD-10-CM | POA: Diagnosis not present

## 2024-04-14 DIAGNOSIS — E785 Hyperlipidemia, unspecified: Secondary | ICD-10-CM | POA: Diagnosis not present

## 2024-04-14 DIAGNOSIS — S72142D Displaced intertrochanteric fracture of left femur, subsequent encounter for closed fracture with routine healing: Secondary | ICD-10-CM | POA: Diagnosis not present

## 2024-04-14 DIAGNOSIS — F0393 Unspecified dementia, unspecified severity, with mood disturbance: Secondary | ICD-10-CM | POA: Diagnosis not present

## 2024-04-14 DIAGNOSIS — E119 Type 2 diabetes mellitus without complications: Secondary | ICD-10-CM | POA: Diagnosis not present

## 2024-04-14 DIAGNOSIS — I10 Essential (primary) hypertension: Secondary | ICD-10-CM | POA: Diagnosis not present

## 2024-04-14 DIAGNOSIS — F32A Depression, unspecified: Secondary | ICD-10-CM | POA: Diagnosis not present

## 2024-04-14 DIAGNOSIS — F0394 Unspecified dementia, unspecified severity, with anxiety: Secondary | ICD-10-CM | POA: Diagnosis not present

## 2024-04-14 DIAGNOSIS — G8929 Other chronic pain: Secondary | ICD-10-CM | POA: Diagnosis not present

## 2024-04-19 DIAGNOSIS — F0393 Unspecified dementia, unspecified severity, with mood disturbance: Secondary | ICD-10-CM | POA: Diagnosis not present

## 2024-04-19 DIAGNOSIS — E785 Hyperlipidemia, unspecified: Secondary | ICD-10-CM | POA: Diagnosis not present

## 2024-04-19 DIAGNOSIS — S72142D Displaced intertrochanteric fracture of left femur, subsequent encounter for closed fracture with routine healing: Secondary | ICD-10-CM | POA: Diagnosis not present

## 2024-04-19 DIAGNOSIS — G8929 Other chronic pain: Secondary | ICD-10-CM | POA: Diagnosis not present

## 2024-04-19 DIAGNOSIS — I10 Essential (primary) hypertension: Secondary | ICD-10-CM | POA: Diagnosis not present

## 2024-04-19 DIAGNOSIS — E119 Type 2 diabetes mellitus without complications: Secondary | ICD-10-CM | POA: Diagnosis not present

## 2024-04-19 DIAGNOSIS — E559 Vitamin D deficiency, unspecified: Secondary | ICD-10-CM | POA: Diagnosis not present

## 2024-04-19 DIAGNOSIS — F32A Depression, unspecified: Secondary | ICD-10-CM | POA: Diagnosis not present

## 2024-04-19 DIAGNOSIS — F0394 Unspecified dementia, unspecified severity, with anxiety: Secondary | ICD-10-CM | POA: Diagnosis not present

## 2024-04-21 DIAGNOSIS — I10 Essential (primary) hypertension: Secondary | ICD-10-CM | POA: Diagnosis not present

## 2024-04-21 DIAGNOSIS — G8929 Other chronic pain: Secondary | ICD-10-CM | POA: Diagnosis not present

## 2024-04-21 DIAGNOSIS — F0394 Unspecified dementia, unspecified severity, with anxiety: Secondary | ICD-10-CM | POA: Diagnosis not present

## 2024-04-21 DIAGNOSIS — E119 Type 2 diabetes mellitus without complications: Secondary | ICD-10-CM | POA: Diagnosis not present

## 2024-04-21 DIAGNOSIS — S72142D Displaced intertrochanteric fracture of left femur, subsequent encounter for closed fracture with routine healing: Secondary | ICD-10-CM | POA: Diagnosis not present

## 2024-04-21 DIAGNOSIS — E785 Hyperlipidemia, unspecified: Secondary | ICD-10-CM | POA: Diagnosis not present

## 2024-04-21 DIAGNOSIS — E559 Vitamin D deficiency, unspecified: Secondary | ICD-10-CM | POA: Diagnosis not present

## 2024-04-21 DIAGNOSIS — F32A Depression, unspecified: Secondary | ICD-10-CM | POA: Diagnosis not present

## 2024-04-21 DIAGNOSIS — F0393 Unspecified dementia, unspecified severity, with mood disturbance: Secondary | ICD-10-CM | POA: Diagnosis not present

## 2024-04-26 DIAGNOSIS — F32A Depression, unspecified: Secondary | ICD-10-CM | POA: Diagnosis not present

## 2024-04-26 DIAGNOSIS — E559 Vitamin D deficiency, unspecified: Secondary | ICD-10-CM | POA: Diagnosis not present

## 2024-04-26 DIAGNOSIS — F0393 Unspecified dementia, unspecified severity, with mood disturbance: Secondary | ICD-10-CM | POA: Diagnosis not present

## 2024-04-26 DIAGNOSIS — G8929 Other chronic pain: Secondary | ICD-10-CM | POA: Diagnosis not present

## 2024-04-26 DIAGNOSIS — E119 Type 2 diabetes mellitus without complications: Secondary | ICD-10-CM | POA: Diagnosis not present

## 2024-04-26 DIAGNOSIS — E785 Hyperlipidemia, unspecified: Secondary | ICD-10-CM | POA: Diagnosis not present

## 2024-04-26 DIAGNOSIS — I10 Essential (primary) hypertension: Secondary | ICD-10-CM | POA: Diagnosis not present

## 2024-04-26 DIAGNOSIS — F0394 Unspecified dementia, unspecified severity, with anxiety: Secondary | ICD-10-CM | POA: Diagnosis not present

## 2024-04-26 DIAGNOSIS — S72142D Displaced intertrochanteric fracture of left femur, subsequent encounter for closed fracture with routine healing: Secondary | ICD-10-CM | POA: Diagnosis not present

## 2024-04-27 DIAGNOSIS — E559 Vitamin D deficiency, unspecified: Secondary | ICD-10-CM | POA: Diagnosis not present

## 2024-04-27 DIAGNOSIS — F0393 Unspecified dementia, unspecified severity, with mood disturbance: Secondary | ICD-10-CM | POA: Diagnosis not present

## 2024-04-27 DIAGNOSIS — S72142D Displaced intertrochanteric fracture of left femur, subsequent encounter for closed fracture with routine healing: Secondary | ICD-10-CM | POA: Diagnosis not present

## 2024-04-27 DIAGNOSIS — E119 Type 2 diabetes mellitus without complications: Secondary | ICD-10-CM | POA: Diagnosis not present

## 2024-04-27 DIAGNOSIS — E785 Hyperlipidemia, unspecified: Secondary | ICD-10-CM | POA: Diagnosis not present

## 2024-04-27 DIAGNOSIS — I10 Essential (primary) hypertension: Secondary | ICD-10-CM | POA: Diagnosis not present

## 2024-04-27 DIAGNOSIS — F0394 Unspecified dementia, unspecified severity, with anxiety: Secondary | ICD-10-CM | POA: Diagnosis not present

## 2024-04-27 DIAGNOSIS — G8929 Other chronic pain: Secondary | ICD-10-CM | POA: Diagnosis not present

## 2024-04-27 DIAGNOSIS — F32A Depression, unspecified: Secondary | ICD-10-CM | POA: Diagnosis not present

## 2024-04-30 ENCOUNTER — Emergency Department (HOSPITAL_COMMUNITY)
Admission: EM | Admit: 2024-04-30 | Discharge: 2024-05-01 | Disposition: A | Discharge status: Home / Self Care | Attending: Emergency Medicine | Admitting: Emergency Medicine

## 2024-04-30 ENCOUNTER — Encounter (HOSPITAL_COMMUNITY): Payer: Self-pay | Admitting: *Deleted

## 2024-04-30 ENCOUNTER — Other Ambulatory Visit: Payer: Self-pay

## 2024-04-30 DIAGNOSIS — I10 Essential (primary) hypertension: Secondary | ICD-10-CM | POA: Diagnosis not present

## 2024-04-30 DIAGNOSIS — K5900 Constipation, unspecified: Secondary | ICD-10-CM | POA: Insufficient documentation

## 2024-04-30 DIAGNOSIS — R112 Nausea with vomiting, unspecified: Secondary | ICD-10-CM | POA: Diagnosis present

## 2024-04-30 DIAGNOSIS — I159 Secondary hypertension, unspecified: Secondary | ICD-10-CM | POA: Insufficient documentation

## 2024-04-30 LAB — CBC
HCT: 36.4 % — ABNORMAL LOW (ref 39.0–52.0)
Hemoglobin: 11.9 g/dL — ABNORMAL LOW (ref 13.0–17.0)
MCH: 28.5 pg (ref 26.0–34.0)
MCHC: 32.7 g/dL (ref 30.0–36.0)
MCV: 87.1 fL (ref 80.0–100.0)
Platelets: 331 K/uL (ref 150–400)
RBC: 4.18 MIL/uL — ABNORMAL LOW (ref 4.22–5.81)
RDW: 12.2 % (ref 11.5–15.5)
WBC: 8.2 K/uL (ref 4.0–10.5)
nRBC: 0 % (ref 0.0–0.2)

## 2024-04-30 LAB — URINALYSIS, ROUTINE W REFLEX MICROSCOPIC
Bilirubin Urine: NEGATIVE
Glucose, UA: NEGATIVE mg/dL
Hgb urine dipstick: NEGATIVE
Ketones, ur: NEGATIVE mg/dL
Leukocytes,Ua: NEGATIVE
Nitrite: NEGATIVE
Protein, ur: NEGATIVE mg/dL
Specific Gravity, Urine: 1.012 (ref 1.005–1.030)
pH: 6 (ref 5.0–8.0)

## 2024-04-30 LAB — LIPASE, BLOOD: Lipase: 41 U/L (ref 11–51)

## 2024-04-30 LAB — COMPREHENSIVE METABOLIC PANEL WITH GFR
ALT: 8 U/L (ref 0–44)
AST: 16 U/L (ref 15–41)
Albumin: 3.5 g/dL (ref 3.5–5.0)
Alkaline Phosphatase: 128 U/L — ABNORMAL HIGH (ref 38–126)
Anion gap: 12 (ref 5–15)
BUN: <5 mg/dL — ABNORMAL LOW (ref 8–23)
CO2: 22 mmol/L (ref 22–32)
Calcium: 8.9 mg/dL (ref 8.9–10.3)
Chloride: 103 mmol/L (ref 98–111)
Creatinine, Ser: 0.76 mg/dL (ref 0.61–1.24)
GFR, Estimated: >60 mL/min (ref >60)
Glucose, Bld: 119 mg/dL — ABNORMAL HIGH (ref 70–99)
Potassium: 3.5 mmol/L (ref 3.5–5.1)
Sodium: 137 mmol/L (ref 135–145)
Total Bilirubin: 0.9 mg/dL (ref 0.0–1.2)
Total Protein: 7.2 g/dL (ref 6.5–8.1)

## 2024-04-30 NOTE — ED Triage Notes (Signed)
 The pt has multiple complaints his bp is high  he has been taking percocet for pain after lt hip surgery one month ago  the percocet is not helping his pain also

## 2024-04-30 NOTE — ED Triage Notes (Signed)
 Pain in his head his back and all over

## 2024-05-01 MED ORDER — POLYETHYLENE GLYCOL 3350 17 G PO PACK
17.0000 g | PACK | Freq: Every day | ORAL | 0 refills | Status: AC
Start: 2024-05-01 — End: ?

## 2024-05-01 MED ORDER — ACETAMINOPHEN 500 MG PO TABS: 1000.0000 mg | ORAL_TABLET | Freq: Four times a day (QID) | ORAL | 0 refills | Status: AC | PRN

## 2024-05-01 MED ORDER — IBUPROFEN 400 MG PO TABS
600.0000 mg | ORAL_TABLET | Freq: Once | ORAL | Status: AC
  Administered 2024-05-01: 600 mg via ORAL
  Filled 2024-05-01: qty 1

## 2024-05-01 NOTE — ED Provider Notes (Signed)
 [place] EMERGENCY DEPARTMENT AT [facility] Provider Note   CSN: [id number] Arrival date & time: 04/30/24  2130     History Chief Complaint  Patient presents with   Constipation    HPI Mike Hart is a 68 y.o. male presenting for chief complaint of constipation. States that he had a hip surgery last month and is coming in tonight with poor po intake and gastritis symptoms. Endorses some nausea and vomiting today. Has tried oxycodone  for headache that started but he felt that worsened his abdominal symptoms.  States that the main reason he came in was his BP and abdominal discomfort. Then he changed and stated that the main reason was a headache for the last 7 days intermittently and states he used his last oxycodone  and wants more for his headache.  Patient very tangential during the conversation requires frequent redirection but continues to talk about unrelated topics.  Patient's recorded medical, surgical, social, medication list and allergies were reviewed in the Snapshot window as part of the initial history.   Review of Systems   Review of Systems  Constitutional:  Negative for chills and fever.  HENT:  Negative for ear pain and sore throat.   Eyes:  Negative for pain and visual disturbance.  Respiratory:  Negative for cough and shortness of breath.   Cardiovascular:  Negative for chest pain and palpitations.  Gastrointestinal:  Positive for abdominal distention and abdominal pain. Negative for vomiting.  Genitourinary:  Negative for dysuria and hematuria.  Musculoskeletal:  Negative for arthralgias and back pain.  Skin:  Negative for color change and rash.  Neurological:  Negative for seizures and syncope.  All other systems reviewed and are negative.   Physical Exam Updated Vital Signs BP (!) 177/79   Pulse 79   Temp 99.4 F (37.4 C) (Oral)   Resp 16   Ht 5' 11 (1.803 m)   Wt 74.8 kg   SpO2 100%   BMI 23.00 kg/m  Physical Exam Vitals  and nursing note reviewed.  Constitutional:      General: He is not in acute distress.    Appearance: He is well-developed.  HENT:     Head: Normocephalic and atraumatic.  Eyes:     Conjunctiva/sclera: Conjunctivae normal.  Cardiovascular:     Rate and Rhythm: Normal rate and regular rhythm.     Heart sounds: No murmur heard. Pulmonary:     Effort: Pulmonary effort is normal. No respiratory distress.     Breath sounds: Normal breath sounds.  Abdominal:     Palpations: Abdomen is soft.     Tenderness: There is no abdominal tenderness.  Musculoskeletal:        General: No swelling.     Cervical back: Neck supple.  Skin:    General: Skin is warm and dry.     Capillary Refill: Capillary refill takes less than 2 seconds.  Neurological:     Mental Status: He is alert.  Psychiatric:        Mood and Affect: Mood normal.      ED Course/ Medical Decision Making/ A&P    Procedures Procedures   Medications Ordered in ED Medications  ibuprofen  (ADVIL ) tablet 600 mg (600 mg Oral Given 05/01/24 0140)   Medical Decision Making:   Ewen A Wymore is a 68 y.o. male who presented to the ED today with a myriad of symptoms detailed above.    Patient placed on continuous vitals and telemetry monitoring while in ED  which was reviewed periodically.  Complete initial physical exam performed, notably the patient  was HDS.    Reviewed and confirmed nursing documentation for past medical history, family history, social history.    Initial Assessment:   With the patient's presentation of elevated blood pressure readings, most likely diagnosis is hypertensive urgency. Other diagnoses associated with hypertensive emergency were considered including (but not limited to) intracranial hemorrhage, acute renal artery stenosis, acute kidney injury, myocardial stress, ophthalmologic emergencies. These are considered less likely due to history of present illness and physical exam findings.   This is  most consistent with an acute life/limb threatening illness complicated by underlying chronic conditions. Will evaluate for hypertensive emergency as below.  Initial Plan:  Screening labs including CBC and Metabolic panel to evaluate for infectious or metabolic etiology of disease.  Objective evaluation as below reviewed. Considered further administration of antihypertensives in ED, per consensus guidelines for [facility] of emergency physicians, acute treatment of hypertensive urgency alone in the emergency department is not recommended.  If patient has evidence of hypertensive emergency on objective laboratory evaluation, will reevaluate.  Will monitor blood pressure while patient awaiting above laboratory studies.  Initial Study Results:   Laboratory  All laboratory results reviewed without evidence of clinically relevant pathology.    Reassessment and Plan:   Objectively, patient's lab work without focal pathology.  Blood pressure remains mildly elevated.  Discussed changing his blood pressure medication but he states he rather follow-up with his normal primary care doctor on Monday for long-term management of his hypertension.  Concerning his intermittent abdominal pain symptoms, this sounds like constipation in the setting of recent opiate use after his femur fracture. Concerning his headache, it seems to be more related to blood pressure though only intermittent in nature and without any sudden onset or thunderclap pathologies. Will treat with Tylenol /NSAID recommend follow-up with PCP within 48 hours for ongoing care management strict return precautions with any acute changes in his syndrome reinforced.  Clinical Impression:  1. Constipation, unspecified constipation type   2. Secondary hypertension      Discharge   Final Clinical Impression(s) / ED Diagnoses Final diagnoses:  Constipation, unspecified constipation type  Secondary hypertension    Rx / DC Orders ED  Discharge Orders          Ordered    polyethylene glycol (MIRALAX ) 17 g packet  Daily        05/01/24 0132    acetaminophen  (TYLENOL ) 500 MG tablet  Every 6 hours PRN        05/01/24 0132              Moranda Billiot, MD 05/01/24 [phone]

## 2024-05-03 DIAGNOSIS — E119 Type 2 diabetes mellitus without complications: Secondary | ICD-10-CM | POA: Diagnosis not present

## 2024-05-03 DIAGNOSIS — F0393 Unspecified dementia, unspecified severity, with mood disturbance: Secondary | ICD-10-CM | POA: Diagnosis not present

## 2024-05-03 DIAGNOSIS — G8929 Other chronic pain: Secondary | ICD-10-CM | POA: Diagnosis not present

## 2024-05-03 DIAGNOSIS — S72142D Displaced intertrochanteric fracture of left femur, subsequent encounter for closed fracture with routine healing: Secondary | ICD-10-CM | POA: Diagnosis not present

## 2024-05-03 DIAGNOSIS — F32A Depression, unspecified: Secondary | ICD-10-CM | POA: Diagnosis not present

## 2024-05-03 DIAGNOSIS — E559 Vitamin D deficiency, unspecified: Secondary | ICD-10-CM | POA: Diagnosis not present

## 2024-05-03 DIAGNOSIS — I10 Essential (primary) hypertension: Secondary | ICD-10-CM | POA: Diagnosis not present

## 2024-05-03 DIAGNOSIS — F0394 Unspecified dementia, unspecified severity, with anxiety: Secondary | ICD-10-CM | POA: Diagnosis not present

## 2024-05-03 DIAGNOSIS — E785 Hyperlipidemia, unspecified: Secondary | ICD-10-CM | POA: Diagnosis not present

## 2024-05-04 DIAGNOSIS — S72142D Displaced intertrochanteric fracture of left femur, subsequent encounter for closed fracture with routine healing: Secondary | ICD-10-CM | POA: Diagnosis not present

## 2024-05-06 DIAGNOSIS — I10 Essential (primary) hypertension: Secondary | ICD-10-CM | POA: Diagnosis not present

## 2024-05-06 DIAGNOSIS — K5909 Other constipation: Secondary | ICD-10-CM | POA: Diagnosis not present

## 2024-05-10 DIAGNOSIS — I10 Essential (primary) hypertension: Secondary | ICD-10-CM | POA: Diagnosis not present

## 2024-05-10 DIAGNOSIS — F32A Depression, unspecified: Secondary | ICD-10-CM | POA: Diagnosis not present

## 2024-05-10 DIAGNOSIS — G8929 Other chronic pain: Secondary | ICD-10-CM | POA: Diagnosis not present

## 2024-05-10 DIAGNOSIS — S72142D Displaced intertrochanteric fracture of left femur, subsequent encounter for closed fracture with routine healing: Secondary | ICD-10-CM | POA: Diagnosis not present

## 2024-05-10 DIAGNOSIS — E785 Hyperlipidemia, unspecified: Secondary | ICD-10-CM | POA: Diagnosis not present

## 2024-05-10 DIAGNOSIS — F0394 Unspecified dementia, unspecified severity, with anxiety: Secondary | ICD-10-CM | POA: Diagnosis not present

## 2024-05-10 DIAGNOSIS — E559 Vitamin D deficiency, unspecified: Secondary | ICD-10-CM | POA: Diagnosis not present

## 2024-05-10 DIAGNOSIS — E119 Type 2 diabetes mellitus without complications: Secondary | ICD-10-CM | POA: Diagnosis not present

## 2024-05-10 DIAGNOSIS — F0393 Unspecified dementia, unspecified severity, with mood disturbance: Secondary | ICD-10-CM | POA: Diagnosis not present

## 2024-05-12 DIAGNOSIS — I10 Essential (primary) hypertension: Secondary | ICD-10-CM | POA: Diagnosis not present

## 2024-05-12 DIAGNOSIS — E1165 Type 2 diabetes mellitus with hyperglycemia: Secondary | ICD-10-CM | POA: Diagnosis not present

## 2024-05-17 DIAGNOSIS — F0394 Unspecified dementia, unspecified severity, with anxiety: Secondary | ICD-10-CM | POA: Diagnosis not present

## 2024-05-17 DIAGNOSIS — I10 Essential (primary) hypertension: Secondary | ICD-10-CM | POA: Diagnosis not present

## 2024-05-17 DIAGNOSIS — F0393 Unspecified dementia, unspecified severity, with mood disturbance: Secondary | ICD-10-CM | POA: Diagnosis not present

## 2024-05-17 DIAGNOSIS — G8929 Other chronic pain: Secondary | ICD-10-CM | POA: Diagnosis not present

## 2024-05-17 DIAGNOSIS — S72142D Displaced intertrochanteric fracture of left femur, subsequent encounter for closed fracture with routine healing: Secondary | ICD-10-CM | POA: Diagnosis not present

## 2024-05-17 DIAGNOSIS — E119 Type 2 diabetes mellitus without complications: Secondary | ICD-10-CM | POA: Diagnosis not present

## 2024-05-17 DIAGNOSIS — E785 Hyperlipidemia, unspecified: Secondary | ICD-10-CM | POA: Diagnosis not present

## 2024-05-17 DIAGNOSIS — F32A Depression, unspecified: Secondary | ICD-10-CM | POA: Diagnosis not present

## 2024-05-17 DIAGNOSIS — E559 Vitamin D deficiency, unspecified: Secondary | ICD-10-CM | POA: Diagnosis not present

## 2024-05-26 DIAGNOSIS — I10 Essential (primary) hypertension: Secondary | ICD-10-CM | POA: Diagnosis not present

## 2024-05-26 DIAGNOSIS — Z79899 Other long term (current) drug therapy: Secondary | ICD-10-CM | POA: Diagnosis not present

## 2024-05-26 DIAGNOSIS — D649 Anemia, unspecified: Secondary | ICD-10-CM | POA: Diagnosis not present

## 2024-05-26 DIAGNOSIS — E559 Vitamin D deficiency, unspecified: Secondary | ICD-10-CM | POA: Diagnosis not present

## 2024-05-26 DIAGNOSIS — E1165 Type 2 diabetes mellitus with hyperglycemia: Secondary | ICD-10-CM | POA: Diagnosis not present

## 2024-06-15 DIAGNOSIS — S72142D Displaced intertrochanteric fracture of left femur, subsequent encounter for closed fracture with routine healing: Secondary | ICD-10-CM | POA: Diagnosis not present

## 2024-08-24 ENCOUNTER — Ambulatory Visit: Admitting: Pulmonary Disease

## 2024-08-24 ENCOUNTER — Encounter: Payer: Self-pay | Admitting: Pulmonary Disease

## 2024-08-24 VITALS — BP 138/70 | HR 68 | Ht 71.0 in | Wt 161.6 lb

## 2024-08-24 DIAGNOSIS — R0602 Shortness of breath: Secondary | ICD-10-CM

## 2024-08-24 MED ORDER — ALBUTEROL SULFATE HFA 108 (90 BASE) MCG/ACT IN AERS: 2.0000 | INHALATION_SPRAY | RESPIRATORY_TRACT | 5 refills | Status: DC | PRN

## 2024-08-24 MED ORDER — ALPRAZOLAM 0.5 MG PO TABS: ORAL_TABLET | ORAL | 3 refills | Status: AC

## 2024-08-24 MED ORDER — ALBUTEROL SULFATE HFA 108 (90 BASE) MCG/ACT IN AERS: 2.0000 | INHALATION_SPRAY | RESPIRATORY_TRACT | 5 refills | Status: AC | PRN

## 2024-08-24 NOTE — Patient Instructions (Signed)
 I will see you back in 6 months  Call us  with significant concerns  I did place refills for your Xanax  to be used 1 tablet twice a day as needed for anxiety Refill for your albuterol   Continue using your allergy medications as well  Make sure you are exercising on a regular basis  Call us  with significant concerns

## 2024-08-24 NOTE — Progress Notes (Signed)
 Mike Hart    [id number]    1955-12-24  Primary Care Physician:Koirala, Dibas, MD  Referring Physician: Regino Slater, MD [address] [place],  [place] [address]  Chief complaint:   Shortness of breath, allergies  HPI:  He is doing well  Denies any significant ongoing symptoms  Has more problems in summer months and the summer was not as bad  He continues to require Xanax  on a regular basis for his anxiety Uses albuterol  as needed  Treated recently for H. pylori infection  Is no longer using Qvar  Has no underlying lung disease, no underlying heart disease  History of hypertension, diabetes hypercholesterolemia all controlled  Reformed smoker quit over 25 years ago  PFT within normal limits, CT scan within normal limits  Has been trying to stay active, exercises regularly   Outpatient Encounter Medications as of 08/24/2024  Medication Sig   acetaminophen  (TYLENOL ) 500 MG tablet Take 2 tablets (1,000 mg total) by mouth every 6 (six) hours as needed.   amLODipine  (NORVASC ) 5 MG tablet TAKE 1 TABLET BY MOUTH EVERY DAY; Duration: 90   atorvastatin  (LIPITOR) 20 MG tablet Take 20 mg by mouth in the morning.   baclofen (LIORESAL) 10 MG tablet Take 10 mg by mouth at bedtime.   calcium  carbonate (OS-CAL - DOSED IN MG OF ELEMENTAL CALCIUM ) 1250 (500 Ca) MG tablet Take 1 tablet (1,250 mg total) by mouth 2 (two) times daily with a meal.   calcium  carbonate (TUMS - DOSED IN MG ELEMENTAL CALCIUM ) 500 MG chewable tablet Chew 2 tablets by mouth as needed for indigestion or heartburn.   docusate sodium  (COLACE) 100 MG capsule Take 1 capsule (100 mg total) by mouth 2 (two) times daily.   escitalopram  (LEXAPRO ) 10 MG tablet Take 10 mg by mouth 2 (two) times daily as needed.   fluticasone  (FLONASE ) 50 MCG/ACT nasal spray Place 1 spray into both nostrils as needed for allergies. For seasonal allergies   lisinopril -hydrochlorothiazide  (ZESTORETIC )  20-25 MG tablet TAKE 1 TABLET EVERY DAY; Duration: 90 days   metFORMIN  (GLUCOPHAGE ) 1000 MG tablet Take 500-1,000 mg by mouth See admin instructions. Take 0.5 tablet (500 mg) by mouth every morning and 1 (1000 mg) every evening   methocarbamol  (ROBAXIN ) 500 MG tablet Take 1 tablet (500 mg total) by mouth every 8 (eight) hours as needed for muscle spasms.   oxyCODONE  (ROXICODONE ) 5 MG immediate release tablet Take 1 tablet (5 mg total) by mouth every 4 (four) hours as needed for moderate pain (pain score 4-6).   pantoprazole  (PROTONIX ) 40 MG tablet Take 40 mg by mouth as needed.   polyethylene glycol (MIRALAX ) 17 g packet Take 17 g by mouth daily.   Vitamin D , Ergocalciferol , (DRISDOL ) 1.25 MG (50000 UNIT) CAPS capsule Take 1 capsule (50,000 Units total) by mouth every 7 (seven) days.   [DISCONTINUED] albuterol  (VENTOLIN  HFA) 108 (90 Base) MCG/ACT inhaler Inhale 2 puffs into the lungs every 4 (four) hours as needed for wheezing or shortness of breath.   [DISCONTINUED] ALPRAZolam  (XANAX ) 0.5 MG tablet TAKE 1 TABLET(0.5 MG) BY MOUTH TWICE DAILY AS NEEDED FOR ANXIETY   albuterol  (VENTOLIN  HFA) 108 (90 Base) MCG/ACT inhaler Inhale 2 puffs into the lungs every 4 (four) hours as needed for wheezing or shortness of breath.   ALPRAZolam  (XANAX ) 0.5 MG tablet TAKE 1 TABLET(0.5 MG) BY MOUTH TWICE DAILY AS NEEDED FOR ANXIETY   No facility-administered encounter medications on file  as of 08/24/2024.    Allergies as of 08/24/2024   (No Known Allergies)    Past Medical History:  Diagnosis Date   Aortic atherosclerosis    Chronic pain    Diabetes mellitus without complication (HCC)    Hyperlipidemia    Hypertension     Past Surgical History:  Procedure Laterality Date   INTRAMEDULLARY (IM) NAIL INTERTROCHANTERIC Left 03/15/2024   Procedure: FIXATION, FRACTURE, INTERTROCHANTERIC, WITH INTRAMEDULLARY ROD;  Surgeon: Kendal Franky SQUIBB, MD;  Location: [facility] OR;  Service: Orthopedics;  Laterality: Left;     Family History  Problem Relation Age of Onset   Lung cancer Mother     Social History   Socioeconomic History   Marital status: Single    Spouse name: Not on file   Number of children: Not on file   Years of education: Not on file   Highest education level: Not on file  Occupational History   Not on file  Tobacco Use   Smoking status: Former    Current packs/day: 0.00    Types: Cigarettes    Quit date: 01/01/1995    Years since quitting: 29.6   Smokeless tobacco: Never  Substance and Sexual Activity   Alcohol use: No   Drug use: Not Currently   Sexual activity: Not on file  Other Topics Concern   Not on file  Social History Narrative   Not on file   Social Drivers of Health   Financial Resource Strain: Not on file  Food Insecurity: No Food Insecurity (03/18/2024)   Hunger Vital Sign    Worried About Running Out of Food in the Last Year: Never true    Ran Out of Food in the Last Year: Never true  Transportation Needs: No Transportation Needs (03/18/2024)   PRAPARE - Administrator, Civil Service (Medical): No    Lack of Transportation (Non-Medical): No  Physical Activity: Not on file  Stress: Not on file  Social Connections: Unknown (03/18/2024)   Social Connection and Isolation Panel    Frequency of Communication with Friends and Family: Once a week    Frequency of Social Gatherings with Friends and Family: Twice a week    Attends Religious Services: 1 to 4 times per year    Active Member of [employer] or Organizations: No    Attends Banker Meetings: 1 to 4 times per year    Marital Status: Patient declined  Intimate Partner Violence: Not At Risk (03/18/2024)   Humiliation, Afraid, Rape, and Kick questionnaire    Fear of Current or Ex-Partner: No    Emotionally Abused: No    Physically Abused: No    Sexually Abused: No    Review of Systems  Constitutional:  Negative for fatigue.  Respiratory:  Positive for chest tightness. Negative for  shortness of breath.   Psychiatric/Behavioral:  The patient is not nervous/anxious.     Vitals:   08/24/24 0935  BP: 138/70  Pulse: 68  SpO2: 100%     Physical Exam Constitutional:      Appearance: Normal appearance.  HENT:     Head: Normocephalic and atraumatic.     Nose: Nose normal.     Mouth/Throat:     Mouth: Mucous membranes are moist.  Eyes:     General: No scleral icterus.    Pupils: Pupils are equal, round, and reactive to light.  Cardiovascular:     Rate and Rhythm: Normal rate and regular rhythm.     Heart  sounds: No murmur heard.    No friction rub.  Pulmonary:     Effort: Pulmonary effort is normal. No respiratory distress.     Breath sounds: No stridor. No wheezing or rhonchi.  Musculoskeletal:     Cervical back: No rigidity or tenderness.  Neurological:     Mental Status: He is alert.  Psychiatric:        Mood and Affect: Mood normal.    Data Reviewed: Chest x-ray reviewed with no abnormality  PFT reviewed within normal limits, CT scan reviewed within normal limits  Assessment:  Multiple allergies - Continue Singulair  Continue Flonase   Anxiety - Continues to use Xanax  twice a day - Was prescribed Cymbalta  previously that he did not tolerate  He does use albuterol  as needed - Does not feel he needs any other inhalers   Plan/Recommendations: Refills for Xanax  placed  Follow-up in 6 months  Continue medications for anxiety  Continue using albuterol  as needed  I spent 30 minutes Pre-charting, reviewing records, interviewing/examining patient, and managing orders.  Jennet Epley MD [facility] 08/24/2024, 10:05 AM  CC: Regino Slater, MD

## 2024-09-14 DIAGNOSIS — S72142D Displaced intertrochanteric fracture of left femur, subsequent encounter for closed fracture with routine healing: Secondary | ICD-10-CM | POA: Diagnosis not present
